# Patient Record
Sex: Female | Born: 1935 | Race: Black or African American | Hispanic: No | Smoking: Current every day smoker
Health system: Southern US, Community
[De-identification: ages and names within clinical notes are randomized; demographics above are authoritative.]

## PROBLEM LIST (undated history)

## (undated) DIAGNOSIS — E1165 Type 2 diabetes mellitus with hyperglycemia: Secondary | ICD-10-CM

## (undated) DIAGNOSIS — E119 Type 2 diabetes mellitus without complications: Secondary | ICD-10-CM

## (undated) DIAGNOSIS — R531 Weakness: Secondary | ICD-10-CM

## (undated) DIAGNOSIS — I639 Cerebral infarction, unspecified: Secondary | ICD-10-CM

## (undated) DIAGNOSIS — E782 Mixed hyperlipidemia: Secondary | ICD-10-CM

## (undated) DIAGNOSIS — Z91199 Patient's noncompliance with other medical treatment and regimen due to unspecified reason: Secondary | ICD-10-CM

## (undated) DIAGNOSIS — I2510 Atherosclerotic heart disease of native coronary artery without angina pectoris: Secondary | ICD-10-CM

## (undated) DIAGNOSIS — N1831 Chronic kidney disease, stage 3a: Secondary | ICD-10-CM

## (undated) DIAGNOSIS — E1121 Type 2 diabetes mellitus with diabetic nephropathy: Secondary | ICD-10-CM

## (undated) DIAGNOSIS — M545 Low back pain, unspecified: Secondary | ICD-10-CM

## (undated) DIAGNOSIS — R269 Unspecified abnormalities of gait and mobility: Secondary | ICD-10-CM

## (undated) DIAGNOSIS — I10 Essential (primary) hypertension: Secondary | ICD-10-CM

## (undated) DIAGNOSIS — M1991 Primary osteoarthritis, unspecified site: Secondary | ICD-10-CM

## (undated) DIAGNOSIS — I6381 Other cerebral infarction due to occlusion or stenosis of small artery: Secondary | ICD-10-CM

## (undated) DIAGNOSIS — R413 Other amnesia: Secondary | ICD-10-CM

## (undated) DIAGNOSIS — D500 Iron deficiency anemia secondary to blood loss (chronic): Secondary | ICD-10-CM

## (undated) DIAGNOSIS — M8580 Other specified disorders of bone density and structure, unspecified site: Secondary | ICD-10-CM

## (undated) HISTORY — DX: Type 2 diabetes mellitus without complications: E11.9

## (undated) HISTORY — PX: OTHER SURGICAL HISTORY: SHX169

## (undated) HISTORY — DX: Essential (primary) hypertension: I10

## (undated) HISTORY — DX: Other specified disorders of bone density and structure, unspecified site: M85.80

## (undated) HISTORY — DX: Type 2 diabetes mellitus with hyperglycemia: E11.65

## (undated) HISTORY — DX: Iron deficiency anemia secondary to blood loss (chronic): D50.0

## (undated) HISTORY — DX: Other cerebral infarction due to occlusion or stenosis of small artery: I63.81

## (undated) HISTORY — DX: Mixed hyperlipidemia: E78.2

## (undated) HISTORY — DX: Atherosclerotic heart disease of native coronary artery without angina pectoris: I25.10

## (undated) HISTORY — DX: Unspecified abnormalities of gait and mobility: R26.9

## (undated) HISTORY — DX: Primary osteoarthritis, unspecified site: M19.91

## (undated) HISTORY — DX: Other amnesia: R41.3

## (undated) HISTORY — DX: Type 2 diabetes mellitus with diabetic nephropathy: E11.21

## (undated) HISTORY — PX: CATARACT EXTRACTION: SUR2

## (undated) HISTORY — DX: Low back pain, unspecified: M54.50

## (undated) HISTORY — DX: Cerebral infarction, unspecified: I63.9

## (undated) HISTORY — DX: Patient's noncompliance with other medical treatment and regimen due to unspecified reason: Z91.199

## (undated) HISTORY — DX: Chronic kidney disease, stage 3a: N18.31

## (undated) HISTORY — DX: Weakness: R53.1

---

## 2003-02-14 ENCOUNTER — Encounter: Payer: Self-pay | Admitting: Gastroenterology

## 2003-02-14 ENCOUNTER — Ambulatory Visit (HOSPITAL_COMMUNITY): Admission: RE | Admit: 2003-02-14 | Discharge: 2003-02-14 | Payer: Self-pay | Admitting: Gastroenterology

## 2003-02-15 ENCOUNTER — Encounter: Payer: Self-pay | Admitting: Gastroenterology

## 2003-02-15 ENCOUNTER — Ambulatory Visit (HOSPITAL_COMMUNITY): Admission: RE | Admit: 2003-02-15 | Discharge: 2003-02-15 | Payer: Self-pay | Admitting: Gastroenterology

## 2004-05-01 ENCOUNTER — Ambulatory Visit (HOSPITAL_COMMUNITY): Admission: RE | Admit: 2004-05-01 | Discharge: 2004-05-01 | Payer: Self-pay | Admitting: Internal Medicine

## 2004-05-20 ENCOUNTER — Encounter: Admission: RE | Admit: 2004-05-20 | Discharge: 2004-07-10 | Payer: Self-pay | Admitting: Internal Medicine

## 2007-05-25 ENCOUNTER — Other Ambulatory Visit: Admission: RE | Admit: 2007-05-25 | Discharge: 2007-05-25 | Payer: Self-pay | Admitting: Cardiology

## 2010-12-28 ENCOUNTER — Encounter: Payer: Self-pay | Admitting: Internal Medicine

## 2011-04-24 NOTE — Op Note (Signed)
   NAME:  Debra Skinner, Debra Skinner NO.:  [id number]   MEDICAL RECORD NO.:  [id number]                   PATIENT TYPE:  OUT   LOCATION:  XRAY                                 FACILITY:  [facility]   PHYSICIAN:  Anselmo Rod, M.D.               DATE OF BIRTH:  1933-11-11   DATE OF PROCEDURE:  02/14/2003  DATE OF DISCHARGE:                                 OPERATIVE REPORT   PROCEDURE PERFORMED:  Colonoscopy up to the hepatic flexure.   ENDOSCOPIST:  Anselmo Rod, M.D.   INSTRUMENT USED:  Olympus video colonoscope.   INDICATIONS FOR PROCEDURE:  A 77 year old African-American female undergoing  screening colonoscopy.  Rule out colonic polyps, masses, etc.   PREPROCEDURE PREPARATION:  Informed consent was procured from the patient.  The patient was fasted for eight hours prior to the procedure and prepped  with a bottle of MiraLax and Gatorade the night prior to the procedure.   PREPROCEDURE PHYSICAL:  VITAL SIGNS:  The patient had stable vital signs.  NECK:  Supple.  CHEST:  Clear to auscultation.  S1 and S2 regular.  ABDOMEN:  Soft with normal bowel sounds.   DESCRIPTION OF PROCEDURE:  The patient was placed in the left lateral  decubitus position and sedated with 100 mg of Demerol and 10 mg of Versed  intravenously.  Once the patient was adequately sedated and maintained on  low flow oxygen and continuous cardiac monitoring, the Olympus video  colonoscope was advanced into the rectum to the hepatic flexure with  difficulty.  The patient had a very tortuous colon.  There was a large  amount of residual stool in the colon and the procedure had to be aborted at  the hepatic flexure with plans to do a barium enema.  Retroflexion revealed  no abnormalities.   IMPRESSION:  1. Unrevealing colonoscopy to the hepatic flexure.  2. Very tortuous colon.  No masses or polyps seen.  3. Significant amount of residual stool in the colon.  Small lesions could     have  been missed.    RECOMMENDATIONS:  1. Await contrast barium enema results with recommendations made thereafter.  2. Outpatient follow up after barium enema has been done.                                               Anselmo Rod, M.D.    JNM/MEDQ  D:  02/14/2003  T:  02/15/2003  Job:  [id number]   cc:   Georgianne Fick, M.D.  [address] [address]  [place]  [place] [address]  Fax: [phone]

## 2011-12-07 ENCOUNTER — Other Ambulatory Visit: Payer: Self-pay | Admitting: Cardiology

## 2011-12-07 DIAGNOSIS — I739 Peripheral vascular disease, unspecified: Secondary | ICD-10-CM

## 2011-12-08 DIAGNOSIS — I639 Cerebral infarction, unspecified: Secondary | ICD-10-CM

## 2011-12-08 HISTORY — DX: Cerebral infarction, unspecified: I63.9

## 2011-12-14 ENCOUNTER — Encounter (INDEPENDENT_AMBULATORY_CARE_PROVIDER_SITE_OTHER): Payer: Medicare Other | Admitting: Cardiology

## 2011-12-14 DIAGNOSIS — E1159 Type 2 diabetes mellitus with other circulatory complications: Secondary | ICD-10-CM

## 2011-12-14 DIAGNOSIS — I739 Peripheral vascular disease, unspecified: Secondary | ICD-10-CM

## 2012-03-14 ENCOUNTER — Ambulatory Visit
Admission: RE | Admit: 2012-03-14 | Discharge: 2012-03-14 | Disposition: A | Discharge status: Home / Self Care | Payer: Medicare Other | Source: Ambulatory Visit | Attending: Internal Medicine | Admitting: Internal Medicine

## 2012-03-14 ENCOUNTER — Other Ambulatory Visit: Payer: Self-pay | Admitting: Internal Medicine

## 2012-03-14 DIAGNOSIS — R410 Disorientation, unspecified: Secondary | ICD-10-CM

## 2012-04-08 ENCOUNTER — Other Ambulatory Visit: Payer: Self-pay | Admitting: Neurology

## 2012-04-08 DIAGNOSIS — R413 Other amnesia: Secondary | ICD-10-CM

## 2012-04-08 DIAGNOSIS — R531 Weakness: Secondary | ICD-10-CM

## 2012-04-08 DIAGNOSIS — R269 Unspecified abnormalities of gait and mobility: Secondary | ICD-10-CM

## 2012-04-08 DIAGNOSIS — I10 Essential (primary) hypertension: Secondary | ICD-10-CM

## 2012-04-08 DIAGNOSIS — E119 Type 2 diabetes mellitus without complications: Secondary | ICD-10-CM

## 2012-04-18 ENCOUNTER — Ambulatory Visit
Admission: RE | Admit: 2012-04-18 | Discharge: 2012-04-18 | Disposition: A | Discharge status: Home / Self Care | Payer: Medicare Other | Source: Ambulatory Visit | Attending: Neurology | Admitting: Neurology

## 2012-04-18 DIAGNOSIS — R269 Unspecified abnormalities of gait and mobility: Secondary | ICD-10-CM

## 2012-04-18 DIAGNOSIS — E119 Type 2 diabetes mellitus without complications: Secondary | ICD-10-CM

## 2012-04-18 DIAGNOSIS — R413 Other amnesia: Secondary | ICD-10-CM

## 2012-04-18 DIAGNOSIS — I10 Essential (primary) hypertension: Secondary | ICD-10-CM

## 2012-04-18 DIAGNOSIS — R531 Weakness: Secondary | ICD-10-CM

## 2012-06-27 ENCOUNTER — Ambulatory Visit: Payer: Medicare Other | Attending: Internal Medicine | Admitting: Physical Therapy

## 2012-06-27 DIAGNOSIS — R269 Unspecified abnormalities of gait and mobility: Secondary | ICD-10-CM | POA: Insufficient documentation

## 2012-06-27 DIAGNOSIS — R5381 Other malaise: Secondary | ICD-10-CM | POA: Insufficient documentation

## 2012-06-27 DIAGNOSIS — IMO0001 Reserved for inherently not codable concepts without codable children: Secondary | ICD-10-CM | POA: Insufficient documentation

## 2012-06-27 DIAGNOSIS — M6281 Muscle weakness (generalized): Secondary | ICD-10-CM | POA: Insufficient documentation

## 2012-07-04 ENCOUNTER — Ambulatory Visit: Payer: Medicare Other | Admitting: Physical Therapy

## 2012-07-08 ENCOUNTER — Ambulatory Visit: Payer: Medicare Other | Attending: Internal Medicine | Admitting: Physical Therapy

## 2012-07-08 DIAGNOSIS — M6281 Muscle weakness (generalized): Secondary | ICD-10-CM | POA: Insufficient documentation

## 2012-07-08 DIAGNOSIS — R5381 Other malaise: Secondary | ICD-10-CM | POA: Insufficient documentation

## 2012-07-08 DIAGNOSIS — R269 Unspecified abnormalities of gait and mobility: Secondary | ICD-10-CM | POA: Insufficient documentation

## 2012-07-08 DIAGNOSIS — IMO0001 Reserved for inherently not codable concepts without codable children: Secondary | ICD-10-CM | POA: Insufficient documentation

## 2012-07-11 ENCOUNTER — Ambulatory Visit: Payer: Medicare Other | Admitting: Physical Therapy

## 2012-07-15 ENCOUNTER — Ambulatory Visit: Payer: Medicare Other | Admitting: Physical Therapy

## 2012-07-18 ENCOUNTER — Other Ambulatory Visit (HOSPITAL_COMMUNITY): Payer: Self-pay | Admitting: Neurology

## 2012-07-18 DIAGNOSIS — I6350 Cerebral infarction due to unspecified occlusion or stenosis of unspecified cerebral artery: Secondary | ICD-10-CM

## 2012-07-19 ENCOUNTER — Ambulatory Visit: Payer: Medicare Other | Admitting: Physical Therapy

## 2012-07-19 ENCOUNTER — Other Ambulatory Visit (HOSPITAL_COMMUNITY): Payer: Medicare Other

## 2012-07-21 ENCOUNTER — Ambulatory Visit: Payer: Medicare Other | Admitting: Physical Therapy

## 2012-07-26 ENCOUNTER — Ambulatory Visit: Payer: Medicare Other | Admitting: Physical Therapy

## 2012-07-28 ENCOUNTER — Ambulatory Visit: Payer: Medicare Other | Admitting: Physical Therapy

## 2012-08-03 ENCOUNTER — Ambulatory Visit (HOSPITAL_COMMUNITY): Payer: Medicare Other | Attending: Cardiology

## 2012-08-03 DIAGNOSIS — E119 Type 2 diabetes mellitus without complications: Secondary | ICD-10-CM | POA: Insufficient documentation

## 2012-08-03 DIAGNOSIS — I6350 Cerebral infarction due to unspecified occlusion or stenosis of unspecified cerebral artery: Secondary | ICD-10-CM

## 2012-08-03 DIAGNOSIS — F17200 Nicotine dependence, unspecified, uncomplicated: Secondary | ICD-10-CM | POA: Insufficient documentation

## 2012-08-03 DIAGNOSIS — I059 Rheumatic mitral valve disease, unspecified: Secondary | ICD-10-CM | POA: Insufficient documentation

## 2012-08-03 DIAGNOSIS — I10 Essential (primary) hypertension: Secondary | ICD-10-CM | POA: Insufficient documentation

## 2012-08-03 DIAGNOSIS — I079 Rheumatic tricuspid valve disease, unspecified: Secondary | ICD-10-CM | POA: Insufficient documentation

## 2012-08-03 DIAGNOSIS — I379 Nonrheumatic pulmonary valve disorder, unspecified: Secondary | ICD-10-CM | POA: Insufficient documentation

## 2012-08-03 NOTE — Progress Notes (Signed)
Echocardiogram performed.  

## 2012-08-04 ENCOUNTER — Encounter (HOSPITAL_COMMUNITY): Payer: Self-pay | Admitting: Neurology

## 2013-06-23 ENCOUNTER — Other Ambulatory Visit: Payer: Self-pay

## 2013-06-23 MED ORDER — CLOPIDOGREL BISULFATE 75 MG PO TABS: 75.0000 mg | ORAL_TABLET | Freq: Every day | ORAL | Status: DC

## 2013-07-21 ENCOUNTER — Ambulatory Visit: Payer: Self-pay | Admitting: Neurology

## 2013-08-10 ENCOUNTER — Ambulatory Visit: Payer: Self-pay | Admitting: Neurology

## 2013-09-22 ENCOUNTER — Ambulatory Visit (INDEPENDENT_AMBULATORY_CARE_PROVIDER_SITE_OTHER): Payer: Medicare Other | Admitting: Cardiovascular Disease

## 2013-09-22 ENCOUNTER — Encounter: Payer: Self-pay | Admitting: Cardiovascular Disease

## 2013-09-22 VITALS — BP 136/68 | HR 67 | Ht 66.0 in | Wt 191.0 lb

## 2013-09-22 DIAGNOSIS — R9431 Abnormal electrocardiogram [ECG] [EKG]: Secondary | ICD-10-CM

## 2013-09-22 DIAGNOSIS — I10 Essential (primary) hypertension: Secondary | ICD-10-CM

## 2013-09-22 DIAGNOSIS — Z79899 Other long term (current) drug therapy: Secondary | ICD-10-CM

## 2013-09-22 DIAGNOSIS — E119 Type 2 diabetes mellitus without complications: Secondary | ICD-10-CM | POA: Insufficient documentation

## 2013-09-22 DIAGNOSIS — E785 Hyperlipidemia, unspecified: Secondary | ICD-10-CM

## 2013-09-22 HISTORY — DX: Abnormal electrocardiogram (ECG) (EKG): R94.31

## 2013-09-22 HISTORY — DX: Type 2 diabetes mellitus without complications: E11.9

## 2013-09-22 HISTORY — DX: Essential (primary) hypertension: I10

## 2013-09-22 NOTE — Assessment & Plan Note (Signed)
Under good control on current medications 

## 2013-09-22 NOTE — Assessment & Plan Note (Signed)
Patient was referred for cardiovascular evaluation because of abnormal EKG. This shows a lethargic hypertrophy with repolarization changes. She did have a 2-D echocardiogram performed January 2013 that showed mild left ventricular hypertrophy. She is completely asymptomatic. We will continue to follow her conservatively.

## 2013-09-22 NOTE — Patient Instructions (Signed)
Your physician recommends that you schedule a follow-up appointment in: 1 year  Your physician recommends that you return for lab work CMP, LIPIDS

## 2013-09-22 NOTE — Progress Notes (Signed)
09/22/2013 Debra Skinner   1933/03/04  [id number]  Primary Physician Georgianne Fick, MD Primary Cardiologist: Runell Gess MD Roseanne Reno   HPI:  Debra Skinner is a 79 year old widowed African American female with no children accompanied by her niece today. She was referred by Dr. Nicholos Johns for cardiovascular evaluation because of an abnormal EKG. Her cardiac risk profile is positive for greater than 60 pack years of tobacco abuse currently smoking one half pack per day. She is treated diabetes and hypertension. There is no family history. She's never had a heart attack but apparently did have a "a stroke" in April 2013 and 2 by Dr. Debarah Crape. She denies chest pain or shortness of breath she is minimally ambulatory. An EKG showed T-wave inversion which had not changed from prior EKGs. On further inspection this appeared to be left ventricular hypertrophy with repolarization changes. She did have a 2-D echocardiogram performed over a year ago that showed normal LV systolic function with mild LVH.   Current Outpatient Prescriptions  Medication Sig Dispense Refill  . amLODipine (NORVASC) 5 MG tablet Take 5 mg by mouth daily.      . Canagliflozin (INVOKANA) 300 MG TABS Take 300 mg by mouth daily.      . clopidogrel (PLAVIX) 75 MG tablet Take 1 tablet (75 mg total) by mouth daily.  90 tablet  1  . Fe Fum-FePoly-Vit C-Vit B3 (INTEGRA PO) Take 1 capsule by mouth daily.      Marland Kitchen lisinopril-hydrochlorothiazide (PRINZIDE,ZESTORETIC) 20-12.5 MG per tablet Take 1 tablet by mouth daily.      . metFORMIN (GLUMETZA) 1000 MG (MOD) 24 hr tablet Take 1,000 mg by mouth 2 (two) times daily with a meal.       No current facility-administered medications for this visit.    Allergies  Allergen Reactions  . Penicillins     Facial edema    History   Social History  . Marital Status: Widowed    Spouse Name: N/A    Number of Children: N/A  . Years of Education: N/A   Occupational History   . Not on file.   Social History Main Topics  . Smoking status: Current Some Day Smoker  . Smokeless tobacco: Not on file  . Alcohol Use: Not on file  . Drug Use: Not on file  . Sexual Activity: Not on file   Other Topics Concern  . Not on file   Social History Narrative  . No narrative on file     Review of Systems: General: negative for chills, fever, night sweats or weight changes.  Cardiovascular: negative for chest pain, dyspnea on exertion, edema, orthopnea, palpitations, paroxysmal nocturnal dyspnea or shortness of breath Dermatological: negative for rash Respiratory: negative for cough or wheezing Urologic: negative for hematuria Abdominal: negative for nausea, vomiting, diarrhea, bright red blood per rectum, melena, or hematemesis Neurologic: negative for visual changes, syncope, or dizziness All other systems reviewed and are otherwise negative except as noted above.    Blood pressure 136/68, pulse 67, height 5\' 6"  (1.676 m), weight 191 lb (86.637 kg).  General appearance: alert and no distress Neck: no adenopathy, no carotid bruit, no JVD, supple, symmetrical, trachea midline and thyroid not enlarged, symmetric, no tenderness/mass/nodules Lungs: clear to auscultation bilaterally Heart: regular rate and rhythm, S1, S2 normal, no murmur, click, rub or gallop Extremities: extremities normal, atraumatic, no cyanosis or edema  EKG normal sinus rhythm at 67 with LVH and repolarization changes.  ASSESSMENT AND PLAN:  Essential hypertension Under good control on current medications  Abnormal EKG Patient was referred for cardiovascular evaluation because of abnormal EKG. This shows a lethargic hypertrophy with repolarization changes. She did have a 2-D echocardiogram performed January 2013 that showed mild left ventricular hypertrophy. She is completely asymptomatic. We will continue to follow her conservatively.      Runell Gess MD FACP,FACC,FAHA,  [facility] 09/22/2013 4:30 PM

## 2013-09-25 ENCOUNTER — Encounter: Payer: Self-pay | Admitting: Cardiovascular Disease

## 2013-10-23 ENCOUNTER — Encounter: Payer: Self-pay | Admitting: Neurology

## 2013-10-23 DIAGNOSIS — R269 Unspecified abnormalities of gait and mobility: Secondary | ICD-10-CM

## 2013-10-23 DIAGNOSIS — R413 Other amnesia: Secondary | ICD-10-CM

## 2013-10-23 DIAGNOSIS — R531 Weakness: Secondary | ICD-10-CM

## 2013-10-24 ENCOUNTER — Ambulatory Visit (INDEPENDENT_AMBULATORY_CARE_PROVIDER_SITE_OTHER): Payer: Medicare Other | Admitting: Neurology

## 2013-10-24 ENCOUNTER — Encounter: Payer: Self-pay | Admitting: Neurology

## 2013-10-24 VITALS — BP 151/70 | HR 62 | Ht 66.5 in | Wt 191.0 lb

## 2013-10-24 DIAGNOSIS — R413 Other amnesia: Secondary | ICD-10-CM

## 2013-10-24 DIAGNOSIS — R269 Unspecified abnormalities of gait and mobility: Secondary | ICD-10-CM

## 2013-10-24 NOTE — Progress Notes (Signed)
History of Present Illness;   [name] is a 79 years old right-handed African American female, accompanied by her niece, long-time friend at today's clinical visit, she is referred by her primary care physician for evaluation of gradual onset memory loss, gait difficulty   She has past medical history of diabetes, recent diagnosis of hypertension, also has chronic left shoulder pain  She had college degree, majored in business, worked at [employer] for many years, retired in 1994, she widowed in 1985, lives alone has no children, quit driving about a month ago. her niece also started paying her bill about a month ago, because she noticed sudden onset of right-hand weakness, difficulty sign her name with pen  She also report one-month history of acute onset worsening gait difficulty, she drags her right leg some, she denies sensory change,   CT head without contrast showed periventricular small vessel disease, laboratory showed A1c 7.3, glucose 142, otherwise normal CMP, LDL 85, triglyceride 167 MRI of brain shows  Mild perisylvian atrophy.  Moderate periventricular and subcortical chronic small vessel ischemic disease.  Focal T1 hyperintensity in the left posterior putamen likely dystrophic mineralization. Left corona radiata DWI hyperintensity, isointense on ADC, hyperintense on T2 and T2FLAIR views, likely subacute-chronic ischemia, which could explain her sudden worsening right leg weakness, gait difficulty.   She continues to have mild gait difficulty, and is receiving physical therapy, which has improved, has made progress, she has been taking aspirin for a long time, lives alone at her house, she lives in her current house for more than 40 years now, has relatives check on her often, she quit driving few months ago, she is still smoking  Carotid doppler was negative for stenosis. 2Decho with grade 1 diastolic dysfunction and pulmonic valve with mild regurgitation otherwise normal.  She is taking Plavix with out problems. She has not had further stroke symptoms.  UPDATE 10/24/2013: Her memory trouble has been fairly stable, she still lives in her house along, driving short distance, taking care of her daily needs, she continue have mild gait difficulty, using a cane, she denies significant pain, no nocturia, she denies bowel bladder incontinence. She quit going to [facility] exercise now,  Review of Systems  Out of a complete 14 system review, the patient complains of only the following symptoms, and all other reviewed systems are negative.     Gait difficulty, she denies neck pain,   Social History  Patient is retired and lives alone. Patient has a college education. Patient quit tobacco about six months ago. Patient denies any history of alcohol and illicit drugs. Inhaled Tobacco Use: Current every day smoker  Family History  Patients paents are both deceased. Cancer  Past Medical History  High blood pressure Diabetes, subcortical chronic small vessel ischemic disease per MRI of the brain   Surgical History  Toenail   Physical Exam  General: Pleasant middle aged [place] American female.   Neck: supple no carotid bruits Respiratory: clear to auscultation bilaterally Cardiovascular: regular rate rhythm  Neurologic Exam  Mental Status: slow to talk, awake, alert, cooperative to history taking, and casual conversation.MMSE 30 out of 30 Cranial Nerves: CN II-XII pupils were equal round reactive to light.   Extraocular movements were full.  Visual fields were full on confrontational test.  Facial sensation were normal. mild shallow right nasolabial fold.  Hearing was intact to finger rubbing bilaterally.  Uvula tongue were midline.  Head turning and shoulder shrugging were normal and symmetric.  Tongue protrusion  into the cheeks strength were normal.  Motor:  limited range of motion of bilateral shoulders, mild right upper extremity pronation,fixation on rapid  orbiting, mild right hip flexion, knee extension,ankle dorsiflexion weakness. Sensory: Normal to light touch, pinprick, proprioception, and vibratory sensation. Coordination: Normal finger-to-nose, heel-to-shin.  There was no dysmetria noticed. Gait and Station: wide based and  cautious, mildly unsteady with tandem walking,  Reflexes: Deep tendon reflexes: Biceps: 3/3 , Brachioradialis: 3/3 , Triceps: 3/3 , Pateller: 3/3, Achilles: 2/2.  Plantar responses are extensor bilaterally  Assessment and plan:  79 years old right-handed [place] American female, with past medical history of essential hypertension, diabetes, presenting with slow onset of short-term memory trouble for 2.5  years, her memory trouble has been fairly steady. She continues to have mild slow worsening gait difficulty, today's examination has demonstrated hyperreflexia, bilateral Babinski signs,   1. differentiation diagnosis including cervical spondylitic myelopathy, vs. extensive periventricular white matter disease, 2 after discussed with patient, we decided to proceed with MRI of cervical spine, 3 refer her to outpatient physical therapy 4    Continue moderate exercise 5 return to clinic in 6 months with Eber Jones

## 2013-11-06 ENCOUNTER — Other Ambulatory Visit (INDEPENDENT_AMBULATORY_CARE_PROVIDER_SITE_OTHER): Payer: Self-pay

## 2013-11-06 DIAGNOSIS — Z0289 Encounter for other administrative examinations: Secondary | ICD-10-CM

## 2013-11-07 ENCOUNTER — Ambulatory Visit: Payer: Medicare Other | Attending: Neurology | Admitting: Physical Therapy

## 2013-11-07 DIAGNOSIS — R293 Abnormal posture: Secondary | ICD-10-CM | POA: Insufficient documentation

## 2013-11-07 DIAGNOSIS — M6281 Muscle weakness (generalized): Secondary | ICD-10-CM | POA: Insufficient documentation

## 2013-11-07 DIAGNOSIS — IMO0001 Reserved for inherently not codable concepts without codable children: Secondary | ICD-10-CM | POA: Insufficient documentation

## 2013-11-07 DIAGNOSIS — M24569 Contracture, unspecified knee: Secondary | ICD-10-CM | POA: Insufficient documentation

## 2013-11-07 DIAGNOSIS — R269 Unspecified abnormalities of gait and mobility: Secondary | ICD-10-CM | POA: Insufficient documentation

## 2013-11-14 ENCOUNTER — Ambulatory Visit: Payer: Medicare Other | Admitting: Physical Therapy

## 2013-11-15 ENCOUNTER — Ambulatory Visit: Payer: Medicare Other | Admitting: Physical Therapy

## 2013-11-17 ENCOUNTER — Ambulatory Visit: Payer: Medicare Other | Admitting: Physical Therapy

## 2013-11-20 ENCOUNTER — Ambulatory Visit
Admission: RE | Admit: 2013-11-20 | Discharge: 2013-11-20 | Disposition: A | Discharge status: Home / Self Care | Payer: Medicare Other | Source: Ambulatory Visit | Attending: Neurology | Admitting: Neurology

## 2013-11-20 DIAGNOSIS — R269 Unspecified abnormalities of gait and mobility: Secondary | ICD-10-CM

## 2013-11-20 DIAGNOSIS — R413 Other amnesia: Secondary | ICD-10-CM

## 2013-11-21 ENCOUNTER — Ambulatory Visit: Payer: Medicare Other | Admitting: Physical Therapy

## 2013-11-22 ENCOUNTER — Telehealth: Payer: Self-pay | Admitting: Neurology

## 2013-11-22 NOTE — Telephone Encounter (Signed)
I have called patient of MRI cervical report, there is multilevel spondylitic disease, at C3 and 4, with moderate left foraminal stenosis, there was no significant cord compression  Dana please mail a copy of MRI cervical to her home

## 2013-11-22 NOTE — Telephone Encounter (Signed)
Debra Skinner, Please call and explain MRI cervical findings.

## 2013-11-22 NOTE — Telephone Encounter (Signed)
Please advise 

## 2013-11-22 NOTE — Telephone Encounter (Signed)
RETURNING CALL REGARDING MRI RESULTS

## 2013-11-24 ENCOUNTER — Ambulatory Visit: Payer: Medicare Other | Admitting: Physical Therapy

## 2013-11-24 NOTE — Telephone Encounter (Signed)
Spoke to patient and explained MRI results, per Dr. Terrace Arabia.  Also mailed copy of report to patient.

## 2013-11-27 ENCOUNTER — Ambulatory Visit: Payer: Medicare Other | Admitting: Physical Therapy

## 2013-11-29 ENCOUNTER — Encounter: Payer: Medicare Other | Admitting: Physical Therapy

## 2013-12-04 ENCOUNTER — Ambulatory Visit: Payer: Medicare Other | Admitting: Physical Therapy

## 2013-12-06 ENCOUNTER — Ambulatory Visit: Payer: Medicare Other | Admitting: Physical Therapy

## 2013-12-12 ENCOUNTER — Ambulatory Visit: Payer: Medicare Other | Attending: Neurology | Admitting: Physical Therapy

## 2013-12-12 DIAGNOSIS — R269 Unspecified abnormalities of gait and mobility: Secondary | ICD-10-CM | POA: Insufficient documentation

## 2013-12-12 DIAGNOSIS — M6281 Muscle weakness (generalized): Secondary | ICD-10-CM | POA: Insufficient documentation

## 2013-12-12 DIAGNOSIS — R293 Abnormal posture: Secondary | ICD-10-CM | POA: Insufficient documentation

## 2013-12-12 DIAGNOSIS — IMO0001 Reserved for inherently not codable concepts without codable children: Secondary | ICD-10-CM | POA: Insufficient documentation

## 2013-12-12 DIAGNOSIS — M24569 Contracture, unspecified knee: Secondary | ICD-10-CM | POA: Insufficient documentation

## 2013-12-14 ENCOUNTER — Ambulatory Visit: Payer: Medicare Other | Admitting: Physical Therapy

## 2013-12-19 ENCOUNTER — Ambulatory Visit: Payer: Medicare Other | Admitting: Physical Therapy

## 2013-12-22 ENCOUNTER — Ambulatory Visit: Payer: Medicare Other | Admitting: Physical Therapy

## 2013-12-26 ENCOUNTER — Ambulatory Visit: Payer: Medicare Other | Admitting: Physical Therapy

## 2013-12-28 ENCOUNTER — Ambulatory Visit: Payer: Medicare Other | Admitting: Physical Therapy

## 2014-03-15 ENCOUNTER — Other Ambulatory Visit: Payer: Self-pay | Admitting: Neurology

## 2014-03-15 NOTE — Telephone Encounter (Signed)
We started prescribing this med in Aug 2013

## 2014-04-25 ENCOUNTER — Encounter (INDEPENDENT_AMBULATORY_CARE_PROVIDER_SITE_OTHER): Payer: Self-pay

## 2014-04-25 ENCOUNTER — Encounter: Payer: Self-pay | Admitting: Nurse Practitioner

## 2014-04-25 ENCOUNTER — Ambulatory Visit (INDEPENDENT_AMBULATORY_CARE_PROVIDER_SITE_OTHER): Payer: Medicare Other | Admitting: Nurse Practitioner

## 2014-04-25 VITALS — BP 130/70 | HR 72 | Ht 66.5 in | Wt 192.0 lb

## 2014-04-25 DIAGNOSIS — R269 Unspecified abnormalities of gait and mobility: Secondary | ICD-10-CM

## 2014-04-25 DIAGNOSIS — R413 Other amnesia: Secondary | ICD-10-CM

## 2014-04-25 MED ORDER — CLOPIDOGREL BISULFATE 75 MG PO TABS: 75.0000 mg | ORAL_TABLET | Freq: Every day | ORAL | Status: DC

## 2014-04-25 NOTE — Progress Notes (Signed)
[facility]  PATIENT: Debra SansGirtha R Skinner DOB: 09/18/1933   REASON FOR VISIT: Followup for memory loss and gait abnormality   HISTORY OF PRESENT ILLNESS: Debra Skinner, 80 year old female returns for followup with her niece. She has a history of gradual onset memory loss and gait difficulty. She did receive some physical therapy after her last visit with Dr. Terrace ArabiaYan 10/24/2013. She is not doing her home exercise program. She has had previous problems with her left shoulder in the past and now is complaining of problems in the right shoulder. She has not been evaluated for this.Her memory trouble has been fairly stable, she still lives in her house along, driving short distance, taking care of her daily needs, she continue have mild gait difficulty, using a cane, no nocturia, she denies bowel bladder incontinence.  MRI of the cervical spine 11/20/2013 showing prominent spondylitic changes most severe at C3-C4 where there is any leftsided osteophyte protrusion resulting in mild canal stenosis without significant cord abnormalities. Remote age lacunar infarct also noted in the right pons incidentally. Patient remains on Plavix. 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 her niece also started paying her bill about a month ago, because she noticed sudden onset of right-hand weakness, difficulty sign her name with pen  She also report one-month history of acute onset worsening gait difficulty, she drags her right leg some, she denies sensory change,  CT head without contrast showed periventricular small vessel disease, laboratory showed A1c  7.3, glucose 142, otherwise normal CMP, LDL 85, triglyceride 167  MRI of brain shows Mild perisylvian atrophy. Moderate periventricular and subcortical chronic small vessel ischemic disease.  Focal T1 hyperintensity in the left posterior putamen likely dystrophic mineralization. Left corona radiata DWI hyperintensity, isointense on ADC, hyperintense on T2 and T2FLAIR views, likely subacute-chronic ischemia, which could explain her sudden worsening right leg weakness, gait difficulty.  She continues to have mild gait difficulty, and is receiving physical therapy, which has improved, has made progress, she has been taking aspirin for a long time, lives alone at her house, she lives in her current house for more than 40 years now, has relatives check on her often, she quit driving few months ago, she is still smoking  Carotid doppler was negative for stenosis. 2Decho with grade 1 diastolic dysfunction and pulmonic valve with mild regurgitation otherwise normal. She is taking Plavix with out problems. She has not had further stroke symptoms.       REVIEW OF SYSTEMS: Full 14 system review of systems performed and notable only for those listed, all others are neg:  Constitutional: N/A  Cardiovascular: N/A  Ear/Nose/Throat: N/A  Skin: N/A  Eyes: N/A  Respiratory: N/A  Gastroitestinal: N/A  Hematology/Lymphatic: N/A  Endocrine: N/A Musculoskeletal: Right shoulder pain  Allergy/Immunology: N/A  Neurological: N/A Psychiatric: N/A Sleep : NA   ALLERGIES: Allergies  Allergen Reactions  . Penicillins     Facial edema    HOME MEDICATIONS: Outpatient Prescriptions Prior to Visit  Medication Sig Dispense Refill  . amLODipine (NORVASC) 5 MG tablet Take 5 mg by mouth daily.      . Canagliflozin (INVOKANA) 300  MG TABS Take 300 mg by mouth daily.      . clopidogrel (PLAVIX) 75 MG tablet TAKE 1 TABLET BY MOUTH EVERY DAY  90 tablet  2  . IRON CR PO Take by mouth daily.      Marland Kitchen.  lisinopril-hydrochlorothiazide (PRINZIDE,ZESTORETIC) 20-12.5 MG per tablet Take 1 tablet by mouth daily.      . metFORMIN (GLUMETZA) 1000 MG (MOD) 24 hr tablet Take 1,000 mg by mouth 2 (two) times daily with a meal.      . Fe Fum-FePoly-Vit C-Vit B3 (INTEGRA PO) Take 1 capsule by mouth daily.       No facility-administered medications prior to visit.    PAST MEDICAL HISTORY: Past Medical History  Diagnosis Date  . Stroke 2013    "mini stroke"  . Diabetes   . Hypertension   . CVA (cerebral infarction)   . Weakness   . Gait disturbance   . Memory loss     PAST SURGICAL HISTORY: Past Surgical History  Procedure Laterality Date  . Toe nail      FAMILY HISTORY: Family History  Problem Relation Age of Onset  . Cervical cancer Mother   . High blood pressure Father   . Aneurysm Father     SOCIAL HISTORY: History   Social History  . Marital Status: Widowed    Spouse Name: N/A    Number of Children: 0  . Years of Education: college   Occupational History  .      Retired   Social History Main Topics  . Smoking status: Current Some Day Smoker -- 0.50 packs/day    Types: Cigarettes  . Smokeless tobacco: Never Used  . Alcohol Use: No  . Drug Use: No  . Sexual Activity: Not on file   Other Topics Concern  . Not on file   Social History Narrative   Patient is retired and lives at   Home alone.    Education- College    Right handed.   Caffeine-  Coffee one daily, soda-  And tea daily.                 PHYSICAL EXAM  Filed Vitals:   04/25/14 1448  BP: 146/75  Pulse: 72  Height: 5' 6.5" (1.689 m)  Weight: 192 lb (87.091 kg)   Body mass index is 30.53 kg/(m^2).  Generalized: Well developed, in no acute distress  Head: normocephalic and atraumatic,. Oropharynx benign  Neck: Supple, no carotid bruits  Cardiac: Regular rate rhythm, no murmur  Musculoskeletal: No deformity   Neurological examination   Mentation: Alert oriented to time, place, history  taking. MMSE 27/30. AFT 6. Follows all commands speech and language fluent  Cranial nerve II-XII: Pupils were equal round reactive to light extraocular movements were full, visual field were full on confrontational test. Facial sensation and strength were normal. hearing was intact to finger rubbing bilaterally. Uvula tongue midline. head turning and shoulder shrug were normal and symmetric.Tongue protrusion into cheek strength was normal. Motor: Limited range of motion to right shoulder and right upper extremity, otherwise  full strength in the BUE, BLE, fine finger movements normal, no pronator drift.  Sensory: normal and symmetric to light touch, pinprick, and  vibration  Coordination: finger-nose-finger, heel-to-shin bilaterally, no dysmetria Reflexes: 2+, upper  Lower and  Symmetric,  plantar responses were flexor bilaterally. Gait and Station: Rising up from seated position without assistance, wide based cautious unsteady gait with cane .   DIAGNOSTIC DATA (LABS, IMAGING,  TESTING) - ASSESSMENT AND PLAN  80 y.o. year old female  has a past medical history of Stroke (2013); Diabetes; Hypertension; CVA (cerebral infarction); Weakness; Gait disturbance; and Memory loss. here to followup.  Memory score is stable Continue Plavix daily Will refill Continue home exercise program, Consult with primary care/ortho for shoulder complaints of pain Followup in 6 months Nilda Riggs, [facility], [facility], APRN  [facility] [address], [address] [place], [place] [address] [phone]

## 2014-04-25 NOTE — Patient Instructions (Signed)
Memory score is stable Continue Plavix daily Will refill Followup in 6 months

## 2014-07-12 ENCOUNTER — Encounter: Payer: Self-pay | Admitting: Nurse Practitioner

## 2014-10-24 ENCOUNTER — Encounter: Payer: Self-pay | Admitting: Neurology

## 2014-10-26 ENCOUNTER — Ambulatory Visit (INDEPENDENT_AMBULATORY_CARE_PROVIDER_SITE_OTHER): Payer: Medicare Other | Admitting: Nurse Practitioner

## 2014-10-26 ENCOUNTER — Ambulatory Visit: Payer: Medicare Other | Admitting: Nurse Practitioner

## 2014-10-26 ENCOUNTER — Encounter: Payer: Self-pay | Admitting: Nurse Practitioner

## 2014-10-26 VITALS — BP 150/67 | HR 70 | Ht 66.5 in | Wt 195.6 lb

## 2014-10-26 DIAGNOSIS — R269 Unspecified abnormalities of gait and mobility: Secondary | ICD-10-CM

## 2014-10-26 DIAGNOSIS — F17200 Nicotine dependence, unspecified, uncomplicated: Secondary | ICD-10-CM

## 2014-10-26 DIAGNOSIS — Z720 Tobacco use: Secondary | ICD-10-CM

## 2014-10-26 DIAGNOSIS — R413 Other amnesia: Secondary | ICD-10-CM

## 2014-10-26 HISTORY — DX: Nicotine dependence, unspecified, uncomplicated: F17.200

## 2014-10-26 MED ORDER — CLOPIDOGREL BISULFATE 75 MG PO TABS: 75.0000 mg | ORAL_TABLET | Freq: Every day | ORAL | Status: DC

## 2014-10-26 NOTE — Patient Instructions (Signed)
Memory score is stable. Continue Plavix daily, Will refill Continue home exercise program. Consult with primary care/ortho for right foot swelling. Followup in 6 months with Dr. Krista Blue, sooner as needed.    Community Occupational psychologist of Services Cost  A Matter of Balance Class locations vary. Call [place] on Aging for more information.  [email or web address]/ [phone] 8-Session program addressing the fear of falling and increasing activity levels of older adults Free to minimal cost  A.C.T. By [employer] [address] [place], [place] [address].  BetaBlues.dk [phone]  Personal training, gym, classes including Silver Sneakers* and ACTion for Aging Adults Fee-based  A.H.O.Y. (Add Health to [place]) Airs on Time [employer] 13, M-F at [place]: [employer],  [place] [facility] [place],  [place], [place] [facility], [address] [facility] [address] [facility] [place], [place], [place], [place] [address]  [place] Location: Sharrell Ku. [employer] [place]      [phone]  [phone]  [phone]  [phone]  [phone]  [phone]  [phone]  [phone]  [phone]  [phone]    [phone] A total-body conditioning class for adults 71 and older; designed to increase muscular strength, endurance, range of movement, flexibility, balance, agility and coordination Free  [facility] [place], [place] [address] [place]      [address]      [phone]      Pilate's class for  individualsreturning to exercise after an injury, before or after surgery or for individuals with complex musculoskeletal issues; designed to improve strength, balance , flexibility      $15/class  [place] [address] [place], [place] [address] [email or web address] [place] classes for beginners to advanced [place], [place] [address] [email or web address] [place] Chair Exercises Free, ages 25 and older; Ages 53-59 fee based  Marvia Pickles, [facility] [address] [place], [place] [address] [email or web address] [phone]  A.H.O.Y. Tai Chi Fee-based Donation based or free  [place] Class locations vary.  Call or email Angela Burke or view website for more information. [email or web address] [email or web address] [phone] Ongoing classes at local [employer] and gyms Fee-based  Silver Sneakers A.C.T. By [place] [employer]: [facility] [place] [phone]  [phone] Classes designed for older adults who want to improve their strength, flexibility, balance and endurance.   Silver sneakers is covered by some insurance plans and includes a fitness center membership at participating locations. Find out more by calling [phone] or visiting [email or web address] Covered by some insurance plans  [facility] [place] [phone] A.H.O.Y.,  fitness room, personal training, fitness classes for injury prevention, strength, balance, flexibility, water fitness classes Ages 55+: $28 for 6 months; Ages 20-54: $54  for 6 months  [place] for Everybody [facility] [address] [place],  [address] [email or web address] [phone] Tai Chi classes for beginners to advanced; geared for seniors Donation Based      [employer] ([place] Others Participate in Exercise     Loyal Gambler. Rosana Hoes, PhD, [place] [email or web address] [place]     [phone]     A comprehensive fitness program for adults.  The program paris senior-level undergraduates Kinesiology students with adults who desire to learn how to exercise safely.  Includes a structural exercise class focusing on functional fitnesss     $100/semester in fall and spring; $75 in summer (no trainers)    *Silver Sneakers is covered by some Personal assistant and includes a  Radio producer at participating locations.  Find out more by calling [phone] or visiting [email or web address]  For additional health and human services resources for senior adults, please contact SeniorLine at [phone] in [place] and [place] at [phone] in all other areas.

## 2014-10-26 NOTE — Progress Notes (Signed)
PATIENT: Debra Skinner DOB: 10/21/1933  REASON FOR VISIT: routine follow up for memory loss and gait difficulty HISTORY FROM: patient  HISTORY OF PRESENT ILLNESS: Debra Skinner, 80 year old female returns for followup, she is unaccompanied today. She was last seen in our office by Eber Jonesarolyn, NP on 04/25/14. She has a history of gradual onset memory loss and gait difficulty. Last time she received physical therapy was after visit with Dr. Terrace ArabiaYan 10/24/2013. She is not doing her home exercise program. Her memory trouble has been fairly stable, she still lives in her house alone, driving short distance, taking care of her daily needs, she continue have mild gait difficulty, using a cane when away from home, no nocturia, she denies bowel or bladder incontinence.  MRI of the cervical spine 11/20/2013 showing prominent spondylitic changes most severe at C3-C4 where there is any leftsided osteophyte protrusion resulting in mild canal stenosis without significant cord abnormalities. Remote age lacunar infarct also noted in the right pons incidentally. Patient remains on Plavix tolerating this well. She has not had further stroke TIA symptoms. She continues to smoke but states she has cut back form 1 ppd previously to 1 pp every 3-4 days. She has some right ankle swelling, which is not painful, which she has not been evaluated for.  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 her niece also started paying her bill about a month ago, because she noticed sudden onset of right-hand weakness, difficulty sign her name with pen   She also report one-month history of acute onset worsening gait difficulty, she drags  her right leg some, she denies sensory change,   CT head without contrast showed periventricular small vessel disease, laboratory showed A1c 7.3, glucose 142, otherwise normal CMP, LDL 85, triglyceride 167   MRI of brain shows Mild perisylvian atrophy. Moderate periventricular and subcortical chronic small vessel ischemic disease.   Focal T1 hyperintensity in the left posterior putamen likely dystrophic mineralization. Left corona radiata DWI hyperintensity, isointense on ADC, hyperintense on T2 and T2FLAIR views, likely subacute-chronic ischemia, which could explain her sudden worsening right leg weakness, gait difficulty.   She continues to have mild gait difficulty, and is receiving physical therapy, which has improved, has made progress, she has been taking aspirin for a long time, lives alone at her house, she lives in her current house for more than 40 years now, has relatives check on her often, she quit driving few months ago, she is still smoking   Carotid doppler was negative for stenosis. 2Decho with grade 1 diastolic dysfunction and pulmonic valve with mild regurgitation otherwise normal. She is taking Plavix with out problems. She has not had further stroke symptoms.      REVIEW OF SYSTEMS: Full 14 system review of systems performed and notable only for: right ankle swelling, difficulty walking  ALLERGIES: Allergies  Allergen Reactions  . Penicillins     Facial edema    HOME MEDICATIONS: Outpatient Prescriptions Prior to Visit  Medication Sig Dispense Refill  . amLODipine (NORVASC) 5 MG tablet Take 5 mg by mouth daily.    . Canagliflozin (INVOKANA) 300 MG TABS Take 300 mg by mouth daily.    .Marland Kitchen  glimepiride (AMARYL) 1 MG tablet Take 1 mg by mouth daily with breakfast.     . IRON CR PO Take by mouth daily.    Marland Kitchen. lisinopril-hydrochlorothiazide (PRINZIDE,ZESTORETIC) 20-12.5 MG per tablet Take 1 tablet by mouth daily.    . metFORMIN (GLUMETZA) 1000 MG (MOD) 24 hr tablet Take 1,000 mg by  mouth 2 (two) times daily with a meal.    . clopidogrel (PLAVIX) 75 MG tablet Take 1 tablet (75 mg total) by mouth daily with breakfast. 90 tablet 1   No facility-administered medications prior to visit.    PHYSICAL EXAM Filed Vitals:   10/26/14 1140  BP: 150/67  Pulse: 70  Height: 5' 6.5" (1.689 m)  Weight: 195 lb 9.6 oz (88.724 kg)   Body mass index is 31.1 kg/(m^2).   MMSE - Mini Mental State Exam 10/26/2014  Orientation to time 5  Orientation to Place 5  Registration 3  Attention/ Calculation 5  Recall 2  Language- name 2 objects 2  Language- repeat 1  Language- follow 3 step command 3  Language- read & follow direction 1  Write a sentence 1  Copy design 0  Total score 28    Generalized: Well developed, in no acute distress, strong odor of cigareetes Head: normocephalic and atraumatic, Oropharynx benign   Neck: Supple, no carotid bruits   Cardiac: Regular rate rhythm, no murmur, mild RLE > LLE edema Musculoskeletal: No deformity   Neurological examination   Mentation: Alert oriented to time, place, history taking. MMSE 28/30. AFT 6. Follows all commands speech and language fluent  Cranial nerve II-XII: Pupils were equal round reactive to light extraocular movements were full, visual field were full on confrontational test. Facial sensation and strength were normal. hearing was intact to finger rubbing bilaterally. Uvula tongue midline. head turning and shoulder shrug were normal and symmetric.Tongue protrusion into cheek strength was normal. Motor: Limited range of motion to right shoulder and right upper extremity, otherwise  full strength in the BUE, BLE, fine finger movements normal, no pronator drift.   Sensory: normal and symmetric to light touch, pinprick, and  vibration   Coordination: finger-nose-finger, heel-to-shin bilaterally, no dysmetria Reflexes: 2+, upper  Lower and  Symmetric,  plantar responses were flexor bilaterally. Gait and Station: Rising up  from seated position without assistance, wide based cautious unsteady gait with cane.  ASSESSMENT: 80  year old female has a past medical history of Stroke (2013); Diabetes; Hypertension; CVA (cerebral infarction); Weakness; Gait disturbance; and Memory loss here to followup.  PLAN: Memory score is stable Continue Plavix daily, Will refill Continue home exercise program, given community resources for falls prevention classes. Consult with primary care/ortho for right foot swelling. Followup in 6 months with Dr. Terrace ArabiaYan, sooner as needed.  Meds ordered this encounter  Medications  . clopidogrel (PLAVIX) 75 MG tablet    Sig: Take 1 tablet (75 mg total) by mouth daily with breakfast.    Dispense:  90 tablet    Refill:  1    Order Specific Question:  Supervising Provider    Answer:  Joycelyn SchmidPENUMALLI, VIKRAM R [[id number]]   Tawny AsalLYNN E. Oriana Horiuchi, MSN, FNP-BC, A/GNP-C 10/26/2014, 12:09 PM [facility] [address], [address] [place], [place] [address] [phone]  Note: This document was prepared with digital dictation and possible smart phrase technology. Any transcriptional errors that result from this process are unintentional.

## 2014-10-30 ENCOUNTER — Encounter: Payer: Self-pay | Admitting: Neurology

## 2014-11-06 NOTE — Progress Notes (Signed)
I agree above plan. 

## 2015-04-26 ENCOUNTER — Ambulatory Visit: Payer: Medicare Other | Admitting: Neurology

## 2015-05-02 ENCOUNTER — Ambulatory Visit: Payer: Self-pay | Admitting: Neurology

## 2015-05-02 ENCOUNTER — Ambulatory Visit (INDEPENDENT_AMBULATORY_CARE_PROVIDER_SITE_OTHER): Payer: Medicare Other | Admitting: Neurology

## 2015-05-02 ENCOUNTER — Encounter: Payer: Self-pay | Admitting: Neurology

## 2015-05-02 VITALS — BP 167/87 | HR 74 | Ht 66.5 in | Wt 190.0 lb

## 2015-05-02 DIAGNOSIS — Z720 Tobacco use: Secondary | ICD-10-CM | POA: Diagnosis not present

## 2015-05-02 DIAGNOSIS — F17200 Nicotine dependence, unspecified, uncomplicated: Secondary | ICD-10-CM

## 2015-05-02 DIAGNOSIS — R269 Unspecified abnormalities of gait and mobility: Secondary | ICD-10-CM | POA: Diagnosis not present

## 2015-05-02 DIAGNOSIS — R413 Other amnesia: Secondary | ICD-10-CM

## 2015-05-02 NOTE — Patient Instructions (Signed)
Please contact your primary care doctor, and the pharmacist soon, to restart your diabetic medications  Return to clinic in 6 months

## 2015-05-02 NOTE — Progress Notes (Signed)
Chief Complaint  Patient presents with  . Gait Disturbance    She is able to slowly ambulate with the assistance of a cane.  . Memory Loss    MMSE 29/30 - 8 animals.  She feels her memory has started to decline.      PATIENT: Debra Skinner DOB: 1933/02/15  REASON FOR VISIT: routine follow up for memory loss and gait difficulty HISTORY FROM: patient  HISTORY OF PRESENT ILLNESS: .  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 her niece also started paying her bill about a month ago, because she noticed sudden onset of right-hand weakness, difficulty sign her name with pen   She also report one-month history of acute onset worsening gait difficulty, she drags her right leg some, she denies sensory change,    CT head without contrast showed periventricular small vessel disease, laboratory showed A1c 7.3, glucose 142, otherwise normal CMP, LDL 85, triglyceride 167    MRI of brain shows Mild perisylvian atrophy. Moderate periventricular and subcortical chronic small vessel ischemic disease.  focal T1 hyperintensity in the left posterior putamen likely dystrophic mineralization. Left corona radiata DWI hyperintensity, isointense on ADC, hyperintense on T2 and T2FLAIR views, likely subacute-chronic ischemia  She continues to have mild gait difficulty, and is receiving physical therapy, which has improved, has made progress, she has been taking aspirin for a long time, lives alone at her house, she lives in her current house for more than 40 years now, has relatives check on her often, she quit driving few months ago, she is still smoking   Carotid doppler was  negative for stenosis. 2Decho with grade 1 diastolic dysfunction and pulmonic valve with mild regurgitation otherwise normal. She is taking Plavix with out problems. She has not had further stroke symptoms.    MRI of the cervical spine 11/20/2013 showing prominent spondylitic changes most severe at C3-C4 where there is any leftsided osteophyte protrusion resulting in mild canal stenosis without significant cord abnormalities. Remote age lacunar infarct also noted in the right pons incidentally. Patient remains on Plavix tolerating this well. She has not had further stroke TIA symptoms. She continues to smoke but states she has cut back form 1 ppd previously to 1 pp every 3-4 days. She has some right ankle swelling, which is not painful, which she has not been evaluated for   UPDATE May 26th 2016: She is with her niece [name] at today's clinical visit, she has missed 3 of her diabetic medications for unknown reasons, only take hypertension medications, and Plavix,  She lives alone at her house, she lived at her current house since 1968, per [place], she smokes all day long, watches TV, not eating regularly, she has quit driving since 1610, mild unsteady gait, no bowel and bladder incontinence. She has no power of attorney, she has no children  REVIEW OF SYSTEMS: Full 14 system review of systems performed and notable only for: Incontinence of bladder ALLERGIES: Allergies  Allergen Reactions  . Penicillins     Facial edema    HOME MEDICATIONS: Outpatient Prescriptions Prior to Visit  Medication Sig Dispense Refill  . amLODipine (NORVASC) 5 MG tablet  Take 5 mg by mouth daily.    . clopidogrel (PLAVIX) 75 MG tablet Take 1 tablet (75 mg total) by mouth daily with breakfast. 90 tablet 1  . glimepiride (AMARYL) 1 MG tablet Take 1 mg by mouth daily with breakfast.     . lisinopril-hydrochlorothiazide (PRINZIDE,ZESTORETIC) 20-12.5 MG per tablet Take 1 tablet by mouth daily.    . metFORMIN (GLUMETZA)  1000 MG (MOD) 24 hr tablet Take 1,000 mg by mouth 2 (two) times daily with a meal.    . Canagliflozin (INVOKANA) 300 MG TABS Take 300 mg by mouth daily.    . IRON CR PO Take by mouth daily.     No facility-administered medications prior to visit.    PHYSICAL EXAM Filed Vitals:   05/02/15 1427  BP: 167/87  Pulse: 74  Height: 5' 6.5" (1.689 m)  Weight: 190 lb (86.183 kg)   Body mass index is 30.21 kg/(m^2).   MMSE - Mini Mental State Exam 05/02/2015 10/26/2014  Orientation to time 5 5  Orientation to Place 5 5  Registration 3 3  Attention/ Calculation 5 5  Recall 2 2  Language- name 2 objects 2 2  Language- repeat 1 1  Language- follow 3 step command 3 3  Language- read & follow direction 1 1  Write a sentence 1 1  Copy design 1 0  Total score 29 28   PHYSICAL EXAMNIATION:  Gen: NAD, conversant, well nourised, obese, well groomed                     Cardiovascular: Regular rate rhythm, no peripheral edema, warm, nontender. Eyes: Conjunctivae clear without exudates or hemorrhage Neck: Supple, no carotid bruise. Pulmonary: Clear to auscultation bilaterally   NEUROLOGICAL EXAM:  MENTAL STATUS: Speech:    Speech is normal; fluent and spontaneous with normal comprehension.  Cognition: MMSE 29/30, she missed 1/3 recalls. Animal naming 8  CRANIAL NERVES: CN II: Visual fields are full to confrontation. Fundoscopic exam is normal with sharp discs and no vascular changes. Pupils are 4 mm and briskly reactive to light.  CN III, IV, VI: extraocular movement are normal. No ptosis. CN V: Facial sensation is intact to pinprick in all 3 divisions bilaterally. Corneal responses are intact.  CN VII: Face is symmetric with normal eye closure and smile. CN VIII: Hearing is normal to rubbing fingers CN IX, X: Palate elevates symmetrically. Phonation is normal. CN XI: Head turning and shoulder shrug are intact CN XII: Tongue is midline with normal movements and no  atrophy.  MOTOR: There is no pronator drift of out-stretched arms. Muscle bulk and tone are normal. Muscle strength is normal.  REFLEXES: Reflexes are 2+ and symmetric at the biceps, triceps, knees, and ankles. Plantar responses are flexor.  SENSORY: Light touch, pinprick, position sense, and vibration sense are intact in fingers and toes.  COORDINATION: Rapid alternating movements and fine finger movements are intact. There is no dysmetria on finger-to-nose and heel-knee-shin. There are no abnormal or extraneous movements.   GAIT/STANCE: Need to push up from seated position, wide-based, cautious,  ASSESSMENT/PLAN: 81  year old female presenting with mild memory trouble, Mini-Mental Status Examination today is 57 out of 30, she has missed her diabetic medication for few months.  1. I have emphasized with her the importance of medical compliance, may reach out to family for help 2, I will hold off starting Namenda or Aricept at this point, with her difficulty managing her medications 3. Return to clinic  in 6 months with nurse practitioner   Levert FeinsteinYijun Canaan Holzer, M.D. Ph.D.  [facility] [address] [place], [place] [address] Phone: [phone] Fax:      [phone]

## 2015-11-04 ENCOUNTER — Telehealth: Payer: Self-pay | Admitting: *Deleted

## 2015-11-04 ENCOUNTER — Ambulatory Visit: Payer: Medicare Other | Admitting: Nurse Practitioner

## 2015-11-04 NOTE — Telephone Encounter (Signed)
I called and spoke to pt.   She could not make it due to her niece's schedule.  She could not find our number.  I gave her our number and she is to let her niece call back and reschedule.

## 2015-11-05 ENCOUNTER — Encounter: Payer: Self-pay | Admitting: Nurse Practitioner

## 2015-11-18 ENCOUNTER — Encounter: Payer: Self-pay | Admitting: Nurse Practitioner

## 2015-11-18 ENCOUNTER — Ambulatory Visit (INDEPENDENT_AMBULATORY_CARE_PROVIDER_SITE_OTHER): Payer: Medicare Other | Admitting: Nurse Practitioner

## 2015-11-18 VITALS — BP 157/72 | HR 74 | Ht 66.5 in | Wt 188.0 lb

## 2015-11-18 DIAGNOSIS — R269 Unspecified abnormalities of gait and mobility: Secondary | ICD-10-CM

## 2015-11-18 DIAGNOSIS — R413 Other amnesia: Secondary | ICD-10-CM

## 2015-11-18 MED ORDER — CLOPIDOGREL BISULFATE 75 MG PO TABS: 75.0000 mg | ORAL_TABLET | Freq: Every day | ORAL | Status: DC

## 2015-11-18 NOTE — Patient Instructions (Signed)
Memory score is stable Continue Plavix daily Will refill Continue home exercise program, F/U in 6 months

## 2015-11-18 NOTE — Progress Notes (Signed)
[facility]  PATIENT: Debra Skinner DOB: October 14, 1933   REASON FOR VISIT: Follow-up for gait disturbance, memory loss HISTORY FROM: Patient and niece    HISTORY OF PRESENT ILLNESS:(Initial visit Nov 2014): gradual onset memory loss, gait difficulty   She has past medical history of diabetes, recent diagnosis of hypertension, also has chronic left shoulder pain   She had college degree, majored in business, worked at [employer] for many years, retired in 1994, she widowed in 1985, lives alone, has no children, quit driving about a month ago. her niece also started paying her bill about a month ago, because she noticed sudden onset of right-hand weakness, difficulty sign her name with pen  She also report one-month history of acute onset worsening gait difficulty, she drags her right leg some, she denies sensory change,   CT head without contrast showed periventricular small vessel disease, laboratory showed A1c 7.3, glucose 142, otherwise normal CMP, LDL 85, triglyceride 167  MRI of brain shows Mild perisylvian atrophy. Moderate periventricular and subcortical chronic small vessel ischemic disease. focal T1 hyperintensity in the left posterior putamen likely dystrophic mineralization. Left corona radiata DWI hyperintensity, isointense on ADC, hyperintense on T2 and T2FLAIR views, likely subacute-chronic ischemia  She continues to have mild gait difficulty, and is receiving physical therapy, which has improved, has made progress, she has been taking aspirin for a long time, lives alone at her house, she lives in her current house for more than 40 years now, has relatives check on her often, she quit driving few months ago, she is still smoking  Carotid doppler was negative for stenosis. 2Decho with grade 1 diastolic dysfunction and pulmonic valve with mild regurgitation otherwise normal. She is taking Plavix with out problems. She has not had further stroke  symptoms.   MRI of the cervical spine 11/20/2013 showing prominent spondylitic changes most severe at C3-C4 where there is any leftsided osteophyte protrusion resulting in mild canal stenosis without significant cord abnormalities. Remote age lacunar infarct also noted in the right pons incidentally. Patient remains on Plavix tolerating this well. She has not had further stroke TIA symptoms. She continues to smoke but states she has cut back form 1 ppd previously to 1 pp every 3-4 days. She has some right ankle swelling, which is not painful, which she has not been evaluated for UPDATE May 26th 2016:She is with her niece [name] at today's clinical visit, she has missed 3 of her diabetic medications for unknown reasons, only take hypertension medications, and Plavix, She lives alone at her house, she lived at her current house since 1968, per [place], she smokes all day long, watches TV, not eating regularly, she has quit driving since 1610, mild unsteady gait, no bowel and bladder incontinence. She has no power of attorney, she has no children UPDATE 11/18/2015. Debra Skinner, 81 year old female returns for follow-up. She is accompanied by her niece. She has not taking some of her diabetic medications and her niece is not quite sure why. She is taking her Plavix. She has not had further stroke or TIA symptoms. She continues to live alone. She has not had any recent falls, there have  been no safety issues identified. She returns for reevaluation  REVIEW OF SYSTEMS: Full 14 system review of systems performed and notable only for those listed, all others are neg:  Constitutional: neg  Cardiovascular: neg Ear/Nose/Throat: neg  Skin: neg Eyes: neg Respiratory: neg Gastroitestinal: neg  Hematology/Lymphatic: neg  Endocrine: neg Musculoskeletal:neg  Allergy/Immunology: neg Neurological: neg Psychiatric: neg Sleep : neg   ALLERGIES: Allergies  Allergen Reactions  . Penicillins     Facial edema      HOME MEDICATIONS: Outpatient Prescriptions Prior to Visit  Medication Sig Dispense Refill  . amLODipine (NORVASC) 5 MG tablet Take 5 mg by mouth daily.    . clopidogrel (PLAVIX) 75 MG tablet Take 1 tablet (75 mg total) by mouth daily with breakfast. 90 tablet 1  . metFORMIN (GLUMETZA) 1000 MG (MOD) 24 hr tablet Take 1,000 mg by mouth 2 (two) times daily with a meal.    . canagliflozin (INVOKANA) 300 MG TABS tablet Take 300 mg by mouth daily before breakfast.    . glimepiride (AMARYL) 1 MG tablet Take 1 mg by mouth daily with breakfast.     . lisinopril-hydrochlorothiazide (PRINZIDE,ZESTORETIC) 20-12.5 MG per tablet Take 1 tablet by mouth daily.     No facility-administered medications prior to visit.    PAST MEDICAL HISTORY: Past Medical History  Diagnosis Date  . Stroke [facility]) 2013    "mini stroke"  . Diabetes (HCC)   . Hypertension   . CVA (cerebral infarction)   . Weakness   . Gait disturbance   . Memory loss     PAST SURGICAL HISTORY: Past Surgical History  Procedure Laterality Date  . Toe nail      FAMILY HISTORY: Family History  Problem Relation Age of Onset  . Cervical cancer Mother   . High blood pressure Father   . Aneurysm Father     SOCIAL HISTORY: Social History   Social History  . Marital Status: Widowed    Spouse Name: N/A  . Number of Children: 0  . Years of Education: college   Occupational History  .      Retired   Social History Main Topics  . Smoking status: Current Some Day Smoker -- 0.50 packs/day    Types: Cigarettes  . Smokeless tobacco: Never Used  . Alcohol Use: No  . Drug Use: No  . Sexual Activity: Not on file   Other Topics Concern  . Not on file   Social History Narrative   Patient is retired and lives at   Home alone.    Education- College    Right handed.   Caffeine-  Coffee one daily, soda-  And tea daily.                 PHYSICAL EXAM  Filed Vitals:   11/18/15 1418  BP: 157/72  Pulse: 74  Height:  5' 6.5" (1.689 m)  Weight: 188 lb (85.276 kg)   Body mass index is 29.89 kg/(m^2).  Generalized: Well developed, in no acute distress  Head: normocephalic and atraumatic,. Oropharynx benign  Neck: Supple, no carotid bruits  Cardiac: Regular rate rhythm, no murmur  Musculoskeletal: No deformity   Neurological examination   Mentation: Alert oriented to time, place, history taking.MMSE 28/30. AFT 6. Clock drawing 4/4.  Attention span and concentration appropriate.   Follows all commands speech and language fluent.   Cranial nerve II-XII: Pupils were equal round reactive to light extraocular movements were full, visual field were full on confrontational test. Facial sensation and strength were normal. hearing was intact to finger rubbing bilaterally. Uvula tongue midline. head turning and shoulder shrug were normal and symmetric.Tongue protrusion into cheek strength was normal. Motor: normal bulk and tone, full strength in the BUE, BLE, fine finger movements normal, no pronator drift. No focal weakness Sensory: normal and  symmetric to light touch, pinprick, and  Vibration, proprioception  Coordination: finger-nose-finger, heel-to-shin bilaterally, no dysmetria Reflexes: Brachioradialis 2/2, biceps 2/2, triceps 2/2, patellar 2/2, Achilles 2/2, plantar responses were flexor bilaterally. Gait and Station: Rising up from seated position with pushoff , wide based cautious gait ambulates with single-point cane  DIAGNOSTIC DATA (LABS, IMAGING, TESTING) - ASSESSMENT AND PLAN  81 y.o. year old female  has a past medical history of Stroke (HCC) (2013); Diabetes (HCC); Hypertension; CVA (cerebral infarction); Weakness; Gait disturbance; and Memory loss. here to follow-up. Her memory score is stable she remains on Plavix for secondary stroke prevention  Continue Plavix daily Will refill Continue home exercise program, F/U in 6 months Nilda RiggsNancy Carolyn Sarinah Doetsch, [facility] PointGNP, [facility] CenterBC, APRN  [facility] [address], [address] [place], [place] [id number] [phone]

## 2015-11-19 NOTE — Progress Notes (Signed)
I have reviewed and agreed above plan. 

## 2016-05-19 ENCOUNTER — Encounter: Payer: Self-pay | Admitting: Nurse Practitioner

## 2016-05-19 ENCOUNTER — Ambulatory Visit (INDEPENDENT_AMBULATORY_CARE_PROVIDER_SITE_OTHER): Payer: Medicare Other | Admitting: Nurse Practitioner

## 2016-05-19 VITALS — BP 138/65 | HR 72 | Ht 66.5 in | Wt 187.6 lb

## 2016-05-19 DIAGNOSIS — I10 Essential (primary) hypertension: Secondary | ICD-10-CM

## 2016-05-19 DIAGNOSIS — R269 Unspecified abnormalities of gait and mobility: Secondary | ICD-10-CM | POA: Diagnosis not present

## 2016-05-19 DIAGNOSIS — R413 Other amnesia: Secondary | ICD-10-CM

## 2016-05-19 MED ORDER — CLOPIDOGREL BISULFATE 75 MG PO TABS: 75.0000 mg | ORAL_TABLET | Freq: Every day | ORAL | Status: DC

## 2016-05-19 NOTE — Patient Instructions (Signed)
Continue Plavix daily Will refill Blood pressure 138/65 continue Norvasc Stop sodas stay well hydrated with water Stop smoking Continue home exercise program, F/U in 6 months

## 2016-05-19 NOTE — Progress Notes (Signed)
[facility]  PATIENT: Debra Skinner DOB: 09/14/1933   REASON FOR VISIT: Follow-up for memory loss gait abnormality, history of stroke HISTORY FROM: Patient and niece    HISTORY OF PRESENT ILLNESS:(Initial visit Nov 2014):YY gradual onset memory loss, gait difficulty  She has past medical history of diabetes, recent diagnosis of hypertension, also has chronic left shoulder pain  She had college degree, majored in business, worked at [employer] for many years, retired in 1994, she widowed in 1985, lives alone, has no children, quit driving about a month ago. her niece also started paying her bill about a month ago, because she noticed sudden onset of right-hand weakness, difficulty sign her name with pen  She also report one-month history of acute onset worsening gait difficulty, she drags her right leg some, she denies sensory change,   CT head without contrast showed periventricular small vessel disease, laboratory showed A1c 7.3, glucose 142, otherwise normal CMP, LDL 85, triglyceride 167 MRI of brain shows Mild perisylvian atrophy. Moderate periventricular and subcortical chronic small vessel ischemic disease. focal T1 hyperintensity in the left posterior putamen likely dystrophic mineralization. Left corona radiata DWI hyperintensity, isointense on ADC, hyperintense on T2 and T2FLAIR views, likely subacute-chronic ischemia  She continues to have mild gait difficulty, and is receiving physical therapy, which has improved, has made progress, she has been taking aspirin for a long time, lives alone at her house, she lives in her current house for more than 40 years now, has relatives check on her often, she quit driving few months ago, she is still smoking  Carotid doppler was negative for stenosis. 2Decho with grade 1 diastolic dysfunction and pulmonic valve with mild regurgitation otherwise normal. She is taking Plavix with out problems. She has not had  further stroke symptoms.   MRI of the cervical spine 11/20/2013 showing prominent spondylitic changes most severe at C3-C4 where there is any leftsided osteophyte protrusion resulting in mild canal stenosis without significant cord abnormalities. Remote age lacunar infarct also noted in the right pons incidentally. Patient remains on Plavix tolerating this well. She has not had further stroke TIA symptoms. She continues to smoke but states she has cut back form 1 ppd previously to 1 pp every 3-4 days. She has some right ankle swelling, which is not painful, which she has not been evaluated for UPDATE May 26th 2016:She is with her niece Lelon MastSamantha at today's clinical visit, she has missed 3 of her diabetic medications for unknown reasons, only take hypertension medications, and Plavix, She lives alone at her house, she lived at her current house since 1968, per VaughnSamantha, she smokes all day long, watches TV, not eating regularly, she has quit driving since 16102015, mild unsteady gait, no bowel and bladder incontinence. She has no power of attorney, she has no children UPDATE 11/18/2015.CM [name], 82 year old female returns for follow-up. She is accompanied by her niece. She has not taking some of her diabetic medications and her niece is not quite sure why. She is taking her Plavix. She has not had further stroke or TIA symptoms. She continues to live alone. She has not had any recent falls, there have been no safety issues identified. She returns for reevaluation UPDATE 06/13/2017CM [name], 82 year old female returns for follow-up with her niece. She has a history of gait abnormality in 2014 where she was dragging her leg. She was placed on Plavix and has not had further stroke or TIA symptoms she continues to live alone.  No safety issues no recent falls. Memory is stable. Metformin has been discontinued and she is now on Amaryl. She continues to smoke and drink soda's. No regular exercise she returns for  reevaluation  REVIEW OF SYSTEMS: Full 14 system review of systems performed and notable only for those listed, all others are neg:  Constitutional: neg  Cardiovascular: neg Ear/Nose/Throat: neg  Skin: neg Eyes: Blurred vision Respiratory: neg Gastroitestinal: Urinary frequency Hematology/Lymphatic: neg  Endocrine: neg Musculoskeletal:neg Allergy/Immunology: neg Neurological: neg Psychiatric: neg Sleep : neg   ALLERGIES: Allergies  Allergen Reactions  . Penicillins     Facial edema    HOME MEDICATIONS: Outpatient Prescriptions Prior to Visit  Medication Sig Dispense Refill  . amLODipine (NORVASC) 5 MG tablet Take 5 mg by mouth daily.    . canagliflozin (INVOKANA) 300 MG TABS tablet Take 300 mg by mouth daily before breakfast.    . clopidogrel (PLAVIX) 75 MG tablet Take 1 tablet (75 mg total) by mouth daily with breakfast. 90 tablet 1  . glimepiride (AMARYL) 1 MG tablet Take 4 mg by mouth daily with breakfast.     . lisinopril-hydrochlorothiazide (PRINZIDE,ZESTORETIC) 20-12.5 MG per tablet Take 1 tablet by mouth daily.    . metFORMIN (GLUMETZA) 1000 MG (MOD) 24 hr tablet Take 1,000 mg by mouth 2 (two) times daily with a meal.     No facility-administered medications prior to visit.    PAST MEDICAL HISTORY: Past Medical History  Diagnosis Date  . Stroke [facility]) 2013    "mini stroke"  . Diabetes (HCC)   . Hypertension   . CVA (cerebral infarction)   . Weakness   . Gait disturbance   . Memory loss     PAST SURGICAL HISTORY: Past Surgical History  Procedure Laterality Date  . Toe nail      FAMILY HISTORY: Family History  Problem Relation Age of Onset  . Cervical cancer Mother   . High blood pressure Father   . Aneurysm Father     SOCIAL HISTORY: Social History   Social History  . Marital Status: Widowed    Spouse Name: N/A  . Number of Children: 0  . Years of Education: college   Occupational History  .      Retired   Social History Main Topics    . Smoking status: Current Some Day Smoker -- 0.50 packs/day    Types: Cigarettes  . Smokeless tobacco: Never Used  . Alcohol Use: No  . Drug Use: No  . Sexual Activity: Not on file   Other Topics Concern  . Not on file   Social History Narrative   Patient is retired and lives at   Home alone.    Education- College    Right handed.   Caffeine-  Coffee one daily, soda-  And tea daily.                 PHYSICAL EXAM  Filed Vitals:   05/19/16 1331  BP: 138/65  Pulse: 72  Height: 5' 6.5" (1.689 m)  Weight: 187 lb 9.6 oz (85.095 kg)   Body mass index is 29.83 kg/(m^2). Generalized: Well developed, in no acute distress  Head: normocephalic and atraumatic,. Oropharynx benign  Neck: Supple, no carotid bruits  Cardiac: Regular rate rhythm, no murmur  Musculoskeletal: No deformity   Neurological examination   Mentation: Alert oriented to time, place, history taking.MMSE 27/30. AFT 3. Clock drawing 4/4. Attention span and concentration appropriate. Follows all commands speech and language fluent.  Cranial nerve II-XII: Pupils were equal round reactive to light extraocular movements were full, visual field were full on confrontational test. Facial sensation and strength were normal. hearing was intact to finger rubbing bilaterally. Uvula tongue midline. head turning and shoulder shrug were normal and symmetric.Tongue protrusion into cheek strength was normal. Motor: normal bulk and tone, full strength in the BUE, BLE, fine finger movements normal, no pronator drift. No focal weakness Sensory: normal and symmetric to light touch, pinprick, and Vibration, proprioception  Coordination: finger-nose-finger, heel-to-shin bilaterally, no dysmetria Reflexes: Brachioradialis 2/2, biceps 2/2, triceps 2/2, patellar 2/2, Achilles 2/2, plantar responses were flexor bilaterally. Gait and Station: Rising up from seated position with pushoff , wide based cautious gait ambulates with  single-point cane   DIAGNOSTIC DATA (LABS, IMAGING, TESTING) - ASSESSMENT AND PLAN 82 y.o. year old female has a past medical history of Stroke (HCC) (2013); Diabetes (HCC); Hypertension; CVA (cerebral infarction); Weakness; Gait disturbance; and Memory loss. here to follow-up. Her memory score is stable she remains on Plavix for secondary stroke prevention  Continue Plavix daily Will refill Blood pressure 138/65 continue Norvasc Stop sodas stay well hydrated with water Stop smoking Continue home exercise program, F/U in 6 months Nilda Riggs, [facility], [facility], APRN  [facility] [address] [place], [place] [address] [phone]

## 2016-05-26 NOTE — Progress Notes (Signed)
I have reviewed and agreed above plan. 

## 2016-06-03 DIAGNOSIS — E119 Type 2 diabetes mellitus without complications: Secondary | ICD-10-CM | POA: Diagnosis not present

## 2016-06-10 DIAGNOSIS — E118 Type 2 diabetes mellitus with unspecified complications: Secondary | ICD-10-CM | POA: Diagnosis not present

## 2016-06-10 DIAGNOSIS — H538 Other visual disturbances: Secondary | ICD-10-CM | POA: Diagnosis not present

## 2016-06-10 DIAGNOSIS — I10 Essential (primary) hypertension: Secondary | ICD-10-CM | POA: Diagnosis not present

## 2016-06-10 DIAGNOSIS — I2510 Atherosclerotic heart disease of native coronary artery without angina pectoris: Secondary | ICD-10-CM | POA: Diagnosis not present

## 2016-07-08 DIAGNOSIS — H25012 Cortical age-related cataract, left eye: Secondary | ICD-10-CM | POA: Diagnosis not present

## 2016-07-08 DIAGNOSIS — H25011 Cortical age-related cataract, right eye: Secondary | ICD-10-CM | POA: Diagnosis not present

## 2016-07-08 DIAGNOSIS — H25042 Posterior subcapsular polar age-related cataract, left eye: Secondary | ICD-10-CM | POA: Diagnosis not present

## 2016-07-08 DIAGNOSIS — H2512 Age-related nuclear cataract, left eye: Secondary | ICD-10-CM | POA: Diagnosis not present

## 2016-07-08 DIAGNOSIS — H35033 Hypertensive retinopathy, bilateral: Secondary | ICD-10-CM | POA: Diagnosis not present

## 2016-08-24 DIAGNOSIS — H2512 Age-related nuclear cataract, left eye: Secondary | ICD-10-CM | POA: Diagnosis not present

## 2016-08-24 DIAGNOSIS — H25042 Posterior subcapsular polar age-related cataract, left eye: Secondary | ICD-10-CM | POA: Diagnosis not present

## 2016-08-24 DIAGNOSIS — H2511 Age-related nuclear cataract, right eye: Secondary | ICD-10-CM | POA: Diagnosis not present

## 2016-08-24 DIAGNOSIS — H269 Unspecified cataract: Secondary | ICD-10-CM | POA: Diagnosis not present

## 2016-08-24 DIAGNOSIS — H25012 Cortical age-related cataract, left eye: Secondary | ICD-10-CM | POA: Diagnosis not present

## 2016-08-24 DIAGNOSIS — H25011 Cortical age-related cataract, right eye: Secondary | ICD-10-CM | POA: Diagnosis not present

## 2016-10-07 DIAGNOSIS — H25812 Combined forms of age-related cataract, left eye: Secondary | ICD-10-CM | POA: Diagnosis not present

## 2016-10-07 DIAGNOSIS — H25042 Posterior subcapsular polar age-related cataract, left eye: Secondary | ICD-10-CM | POA: Diagnosis not present

## 2016-10-07 DIAGNOSIS — H2512 Age-related nuclear cataract, left eye: Secondary | ICD-10-CM | POA: Diagnosis not present

## 2016-10-07 DIAGNOSIS — H2522 Age-related cataract, morgagnian type, left eye: Secondary | ICD-10-CM | POA: Diagnosis not present

## 2016-10-07 DIAGNOSIS — H21562 Pupillary abnormality, left eye: Secondary | ICD-10-CM | POA: Diagnosis not present

## 2016-11-24 ENCOUNTER — Ambulatory Visit (INDEPENDENT_AMBULATORY_CARE_PROVIDER_SITE_OTHER): Payer: Medicare Other | Admitting: Nurse Practitioner

## 2016-11-24 ENCOUNTER — Encounter: Payer: Self-pay | Admitting: Nurse Practitioner

## 2016-11-24 VITALS — BP 129/66 | HR 77 | Ht 66.5 in | Wt 187.8 lb

## 2016-11-24 DIAGNOSIS — I10 Essential (primary) hypertension: Secondary | ICD-10-CM

## 2016-11-24 DIAGNOSIS — Z8673 Personal history of transient ischemic attack (TIA), and cerebral infarction without residual deficits: Secondary | ICD-10-CM | POA: Insufficient documentation

## 2016-11-24 DIAGNOSIS — R269 Unspecified abnormalities of gait and mobility: Secondary | ICD-10-CM

## 2016-11-24 DIAGNOSIS — I6381 Other cerebral infarction due to occlusion or stenosis of small artery: Secondary | ICD-10-CM | POA: Insufficient documentation

## 2016-11-24 DIAGNOSIS — R413 Other amnesia: Secondary | ICD-10-CM | POA: Diagnosis not present

## 2016-11-24 MED ORDER — CLOPIDOGREL BISULFATE 75 MG PO TABS: 75.0000 mg | ORAL_TABLET | Freq: Every day | ORAL | 3 refills | Status: AC

## 2016-11-24 NOTE — Progress Notes (Signed)
I have reviewed and agreed above plan. 

## 2016-11-24 NOTE — Patient Instructions (Addendum)
Continue Plavix daily Will refill Blood pressure 129/66 continue Norvasc Stay well hydrated with water Stop smoking Continue home exercise program,use cane for safe ambulation F/U in 6 months next with Dr. Terrace ArabiaYan

## 2016-11-24 NOTE — Progress Notes (Signed)
[facility]  PATIENT: Debra Skinner DOB: 09/14/1933   REASON FOR VISIT: Follow-up for memory loss gait abnormality, history of stroke HISTORY FROM: Patient and niece    HISTORY OF PRESENT ILLNESS:(Initial visit Nov 2014):YY gradual onset memory loss, gait difficulty  She has past medical history of diabetes, recent diagnosis of hypertension, also has chronic left shoulder pain  She had college degree, majored in business, worked at [employer] human [employer] for many years, retired in 1994, she widowed in 1985, lives alone, has no children, quit driving about a month ago. her niece also started paying her bill about a month ago, because she noticed sudden onset of right-hand weakness, difficulty sign her name with pen  She also report one-month history of acute onset worsening gait difficulty, she drags her right leg some, she denies sensory change,   CT head without contrast showed periventricular small vessel disease, laboratory showed A1c 7.3, glucose 142, otherwise normal CMP, LDL 85, triglyceride 167 MRI of brain shows Mild perisylvian atrophy. Moderate periventricular and subcortical chronic small vessel ischemic disease. focal T1 hyperintensity in the left posterior putamen likely dystrophic mineralization. Left corona radiata DWI hyperintensity, isointense on ADC, hyperintense on T2 and T2FLAIR views, likely subacute-chronic ischemia  She continues to have mild gait difficulty, and is receiving physical therapy, which has improved, has made progress, she has been taking aspirin for a long time, lives alone at her house, she lives in her current house for more than 40 years now, has relatives check on her often, she quit driving few months ago, she is still smoking  Carotid doppler was negative for stenosis. 2Decho with grade 1 diastolic dysfunction and pulmonic valve with mild regurgitation otherwise normal. She is taking Plavix with out problems. She has not had  further stroke symptoms.   MRI of the cervical spine 11/20/2013 showing prominent spondylitic changes most severe at C3-C4 where there is any leftsided osteophyte protrusion resulting in mild canal stenosis without significant cord abnormalities. Remote age lacunar infarct also noted in the right pons incidentally. Patient remains on Plavix tolerating this well. She has not had further stroke TIA symptoms. She continues to smoke but states she has cut back form 1 ppd previously to 1 pp every 3-4 days. She has some right ankle swelling, which is not painful, which she has not been evaluated for UPDATE May 26th 2016:She is with her niece Lelon MastSamantha at today's clinical visit, she has missed 3 of her diabetic medications for unknown reasons, only take hypertension medications, and Plavix, She lives alone at her house, she lived at her current house since 1968, per Debra Skinner, she smokes all day long, watches TV, not eating regularly, she has quit driving since [id number], mild unsteady gait, no bowel and bladder incontinence. She has no power of attorney, she has no children UPDATE 11/18/2015.CM Debra Skinner, 82 year old female returns for follow-up. She is accompanied by her niece. She has not taking some of her diabetic medications and her niece is not quite sure why. She is taking her Plavix. She has not had further stroke or TIA symptoms. She continues to live alone. She has not had any recent falls, there have been no safety issues identified. She returns for reevaluation UPDATE 06/13/2017CM Debra Skinner, 82 year old female returns for follow-up with her niece. She has a history of gait abnormality in 2014 where she was dragging her leg. She was placed on Plavix and has not had further stroke or TIA symptoms she continues to live alone.  No safety issues no recent falls. Memory is stable. Metformin has been discontinued and she is now on Amaryl. She continues to smoke and drink soda's. No regular exercise she returns for  reevaluation UPDATE 12/19/2017CM Debra Skinner, 82 year old female returns for follow-up with her niece with history of gait abnormality, lacunar infarct and mild memory loss. She remains on Plavix for secondary stroke prevention without further stroke or TIA symptoms she has minimal bruising and no bleeding. She continues to live alone without safety issues identified her memory is stable she continues to smoke even though she has been encouraged to stop she gets no regular exercise. No recent falls. She ambulates with a single-point cane She returns for reevaluation   REVIEW OF SYSTEMS: Full 14 system review of systems performed and notable only for those listed, all others are neg:  Constitutional: neg  Cardiovascular: neg Ear/Nose/Throat: neg  Skin: neg Eyes: Blurred vision Respiratory: neg Gastroitestinal: Urinary frequency Hematology/Lymphatic: neg  Endocrine: neg Musculoskeletal:neg Allergy/Immunology: neg Neurological: neg Psychiatric: neg Sleep : neg   ALLERGIES: Allergies  Allergen Reactions  . Penicillins     Facial edema    HOME MEDICATIONS: Outpatient Medications Prior to Visit  Medication Sig Dispense Refill  . amLODipine (NORVASC) 5 MG tablet Take 5 mg by mouth daily.    . clopidogrel (PLAVIX) 75 MG tablet Take 1 tablet (75 mg total) by mouth daily with breakfast. 90 tablet 1  . glimepiride (AMARYL) 1 MG tablet Take 4 mg by mouth daily with breakfast.     . lisinopril-hydrochlorothiazide (PRINZIDE,ZESTORETIC) 20-12.5 MG per tablet Take 1 tablet by mouth daily.    . canagliflozin (INVOKANA) 300 MG TABS tablet Take 300 mg by mouth daily before breakfast.     No facility-administered medications prior to visit.     PAST MEDICAL HISTORY: Past Medical History:  Diagnosis Date  . CVA (cerebral infarction)   . Diabetes (HCC)   . Gait disturbance   . Hypertension   . Memory loss   . Stroke [facility](HCC) 2013   "mini stroke"  . Weakness     PAST SURGICAL HISTORY: Past  Surgical History:  Procedure Laterality Date  . CATARACT EXTRACTION Left    10-07-16  . toe nail      FAMILY HISTORY: Family History  Problem Relation Age of Onset  . Cervical cancer Mother   . High blood pressure Father   . Aneurysm Father     SOCIAL HISTORY: Social History   Social History  . Marital status: Widowed    Spouse name: N/A  . Number of children: 0  . Years of education: college   Occupational History  .  Retired    Retired   Social History Main Topics  . Smoking status: Current Some Day Smoker    Packs/day: 0.50    Types: Cigarettes  . Smokeless tobacco: Never Used  . Alcohol use No  . Drug use: No  . Sexual activity: Not on file   Other Topics Concern  . Not on file   Social History Narrative   Patient is retired and lives at   Home alone.    Education- College    Right handed.   Caffeine-  Coffee one daily, soda-  And tea daily.                 PHYSICAL EXAM  Vitals:   11/24/16 1239  BP: 129/66  Pulse: 77  Weight: 187 lb 12.8 oz (85.2 kg)  Height: 5' 6.5" (1.689  m)   Body mass index is 29.86 kg/m. Generalized: Well developed, in no acute distress  Head: normocephalic and atraumatic,. Oropharynx benign  Neck: Supple, no carotid bruits  Cardiac: Regular rate rhythm, no murmur  Musculoskeletal: No deformity   Neurological examination   Mentation: Alert oriented to time, place, history taking.MMSE 25/30. AFT5. Clock drawing 4/4. Attention span and concentration appropriate. Follows all commands speech and language fluent.   Cranial nerve II-XII: Pupils were equal round reactive to light extraocular movements were full, visual field were full on confrontational test. Facial sensation and strength were normal. hearing was intact to finger rubbing bilaterally. Uvula tongue midline. head turning and shoulder shrug were normal and symmetric.Tongue protrusion into cheek strength was normal. Motor: normal bulk and tone, full  strength in the BUE, BLE, fine finger movements normal, no pronator drift. No focal weakness Sensory: normal and symmetric to light touch, pinprick, and Vibratioin the upper and lower extremities Coordination  finger-nose-finger, heel-to-shin bilaterally, no dysmetria ReflexesSymmetric upper and lowerantar responses were flexor bilaterally. Gait and Station: Rising up from seated position with pushoff , wide based cautious gait ambulates with single-point cane   DIAGNOSTIC DATA (LABS, IMAGING, TESTING) - ASSESSMENT AND PLAN 82 y.o. year old female has a past medical history of Stroke (HCC) (2013); Diabetes (HCC); Hypertension; CVA (cerebral infarction); Weakness; Gait disturbance; and Memory loss. here to follow-up. Her memory score is stable she remains on Plavix for secondary stroke prevention  Continue Plavix daily Will refill Blood pressure 129/66 continue Norvasc Stay well hydrated with water Stop smoking Continue home exercise program,use cane for safe ambulation F/U in 6 months next with Dr. Carmelina Noun, [facility], [facility], APRN  [facility] [address] [place], [place] [address] [phone]

## 2016-12-28 DIAGNOSIS — Z1231 Encounter for screening mammogram for malignant neoplasm of breast: Secondary | ICD-10-CM | POA: Diagnosis not present

## 2017-01-05 DIAGNOSIS — Z0000 Encounter for general adult medical examination without abnormal findings: Secondary | ICD-10-CM | POA: Diagnosis not present

## 2017-01-05 DIAGNOSIS — Z780 Asymptomatic menopausal state: Secondary | ICD-10-CM | POA: Diagnosis not present

## 2017-01-05 DIAGNOSIS — N951 Menopausal and female climacteric states: Secondary | ICD-10-CM | POA: Diagnosis not present

## 2017-01-05 DIAGNOSIS — D649 Anemia, unspecified: Secondary | ICD-10-CM | POA: Diagnosis not present

## 2017-01-05 DIAGNOSIS — I10 Essential (primary) hypertension: Secondary | ICD-10-CM | POA: Diagnosis not present

## 2017-01-05 DIAGNOSIS — I2510 Atherosclerotic heart disease of native coronary artery without angina pectoris: Secondary | ICD-10-CM | POA: Diagnosis not present

## 2017-01-05 DIAGNOSIS — E118 Type 2 diabetes mellitus with unspecified complications: Secondary | ICD-10-CM | POA: Diagnosis not present

## 2017-01-13 DIAGNOSIS — D500 Iron deficiency anemia secondary to blood loss (chronic): Secondary | ICD-10-CM | POA: Diagnosis not present

## 2017-01-13 DIAGNOSIS — E782 Mixed hyperlipidemia: Secondary | ICD-10-CM | POA: Diagnosis not present

## 2017-01-13 DIAGNOSIS — E118 Type 2 diabetes mellitus with unspecified complications: Secondary | ICD-10-CM | POA: Diagnosis not present

## 2017-01-13 DIAGNOSIS — Z23 Encounter for immunization: Secondary | ICD-10-CM | POA: Diagnosis not present

## 2017-01-13 DIAGNOSIS — I2510 Atherosclerotic heart disease of native coronary artery without angina pectoris: Secondary | ICD-10-CM | POA: Diagnosis not present

## 2017-01-21 DIAGNOSIS — L602 Onychogryphosis: Secondary | ICD-10-CM | POA: Diagnosis not present

## 2017-01-21 DIAGNOSIS — M79671 Pain in right foot: Secondary | ICD-10-CM | POA: Diagnosis not present

## 2017-01-21 DIAGNOSIS — M79672 Pain in left foot: Secondary | ICD-10-CM | POA: Diagnosis not present

## 2017-01-21 DIAGNOSIS — E1351 Other specified diabetes mellitus with diabetic peripheral angiopathy without gangrene: Secondary | ICD-10-CM | POA: Diagnosis not present

## 2017-05-18 DIAGNOSIS — D500 Iron deficiency anemia secondary to blood loss (chronic): Secondary | ICD-10-CM | POA: Diagnosis not present

## 2017-05-18 DIAGNOSIS — E118 Type 2 diabetes mellitus with unspecified complications: Secondary | ICD-10-CM | POA: Diagnosis not present

## 2017-05-25 ENCOUNTER — Ambulatory Visit (INDEPENDENT_AMBULATORY_CARE_PROVIDER_SITE_OTHER): Payer: Medicare Other | Admitting: Neurology

## 2017-05-25 ENCOUNTER — Encounter: Payer: Self-pay | Admitting: Neurology

## 2017-05-25 VITALS — BP 142/59 | HR 73 | Ht 66.5 in | Wt 193.5 lb

## 2017-05-25 DIAGNOSIS — Z8673 Personal history of transient ischemic attack (TIA), and cerebral infarction without residual deficits: Secondary | ICD-10-CM | POA: Diagnosis not present

## 2017-05-25 DIAGNOSIS — R269 Unspecified abnormalities of gait and mobility: Secondary | ICD-10-CM

## 2017-05-25 DIAGNOSIS — R413 Other amnesia: Secondary | ICD-10-CM

## 2017-05-25 NOTE — Progress Notes (Signed)
[facility]  PATIENT: Debra Skinner DOB: 02/27/1933   HISTORY OF PRESENT ILLNESS:(Initial visit Nov 2014): gradual onset memory loss, gait difficulty  She has past medical history of diabetes, recent diagnosis of hypertension, also has chronic left shoulder pain  She had college degree, majored in business, worked at [employer] for many years, retired in 1994, she widowed in 1985, lives alone, has no children, quit driving about a month ago. her niece also started paying her bill about a month ago, because she noticed sudden onset of right-hand weakness, difficulty sign her name with pen  She also report one-month history of acute onset worsening gait difficulty, she drags her right leg some, she denies sensory change,   CT head without contrast showed periventricular small vessel disease, laboratory showed A1c 7.3, glucose 142, otherwise normal CMP, LDL 85, triglyceride 167  MRI of brain shows Mild perisylvian atrophy. Moderate periventricular and subcortical chronic small vessel ischemic disease. focal T1 hyperintensity in the left posterior putamen likely dystrophic mineralization. Left corona radiata DWI hyperintensity, isointense on ADC, hyperintense on T2 and T2FLAIR views, likely subacute-chronic ischemia  She continues to have mild gait difficulty, and is receiving physical therapy, which has improved, has made progress, she has been taking aspirin for a long time, lives alone at her house, she lives in her current house for more than 40 years now, has relatives check on her often, she quit driving few months ago, she is still smoking  Carotid doppler was negative for stenosis. 2Decho with grade 1 diastolic dysfunction and pulmonic valve with mild regurgitation otherwise normal. She is taking Plavix with out problems. She has not had further stroke symptoms.   MRI of the cervical spine 11/20/2013 showing prominent spondylitic changes most severe at  C3-C4 where there is any leftsided osteophyte protrusion resulting in mild canal stenosis without significant cord abnormalities. Remote age lacunar infarct also noted in the right pons incidentally. Patient remains on Plavix tolerating this well. She has not had further stroke TIA symptoms. She continues to smoke but states she has cut back form 1 ppd previously to 1 pp every 3-4 days. She has some right ankle swelling, which is not painful, which she has not been evaluated for  UPDATE May 26th 2016:She is with her niece Debra Skinner at today's clinical visit, she has missed 3 of her diabetic medications for unknown reasons, only take hypertension medications, and Plavix, She lives alone at her house, she lived at her current house since 1968, per [place], she smokes all day long, watches TV, not eating regularly, she has quit driving since 2956, mild unsteady gait, no bowel and bladder incontinence. She has no power of attorney, she has no children  UPDATE May 25 2017: She lives by herself in her house, she has been lifting her house since 1968, she ambulate with a 4 foot cane, she is no longer driving,  She denies significant pain, she eats well, sleeps well.    REVIEW OF SYSTEMS: Full 14 system review of systems performed and notable only for those listed, all others are neg:      ALLERGIES: Allergies  Allergen Reactions  . Penicillins     Facial edema    HOME MEDICATIONS: Outpatient Medications Prior to Visit  Medication Sig Dispense Refill  . amLODipine (NORVASC) 5 MG tablet Take 5 mg by mouth daily.    . clopidogrel (PLAVIX) 75 MG tablet Take 1 tablet (75 mg total) by mouth daily with breakfast. 90  tablet 3  . lisinopril-hydrochlorothiazide (PRINZIDE,ZESTORETIC) 20-12.5 MG per tablet Take 1 tablet by mouth daily.    Marland Kitchen. glimepiride (AMARYL) 1 MG tablet Take 4 mg by mouth daily with breakfast.     . prednisoLONE acetate (PRED FORTE) 1 % ophthalmic suspension INSTILL 1 DROP IN LEFT EYE  FOUR TIMES A DAY START AFTER SURGERY  1   No facility-administered medications prior to visit.     PAST MEDICAL HISTORY: Past Medical History:  Diagnosis Date  . CVA (cerebral infarction)   . Diabetes (HCC)   . Gait disturbance   . Hypertension   . Memory loss   . Stroke [facility](HCC) 2013   "mini stroke"  . Weakness     PAST SURGICAL HISTORY: Past Surgical History:  Procedure Laterality Date  . CATARACT EXTRACTION Left    10-07-16  . toe nail      FAMILY HISTORY: Family History  Problem Relation Age of Onset  . Cervical cancer Mother   . High blood pressure Father   . Aneurysm Father     SOCIAL HISTORY: Social History   Social History  . Marital status: Widowed    Spouse name: N/A  . Number of children: 0  . Years of education: college   Occupational History  .  Retired    Retired   Social History Main Topics  . Smoking status: Current Some Day Smoker    Packs/day: 0.50    Types: Cigarettes  . Smokeless tobacco: Never Used  . Alcohol use No  . Drug use: No  . Sexual activity: Not on file   Other Topics Concern  . Not on file   Social History Narrative   Patient is retired and lives at   Home alone.    Education- College    Right handed.   Caffeine-  Coffee one daily, soda-  And tea daily.                 PHYSICAL EXAM  Vitals:   05/25/17 1305  BP: (!) 142/59  Pulse: 73  Weight: 193 lb 8 oz (87.8 kg)  Height: 5' 6.5" (1.689 m)   Body mass index is 30.76 kg/m. Generalized: Well developed, in no acute distress  Head: normocephalic and atraumatic,. Oropharynx benign  Neck: Supple, no carotid bruits  Cardiac: Regular rate rhythm, no murmur  Musculoskeletal: No deformity   Neurological examination   MMSE - Mini Mental State Exam 05/25/2017 11/24/2016 05/19/2016  Orientation to time 5 4 4   Orientation to Place 5 5 3   Registration 3 3 3   Attention/ Calculation 5 5 5   Recall 2 1 1   Language- name 2 objects 2 2 2   Language- repeat 1 1 1    Language- follow 3 step command 3 3 3   Language- read & follow direction 1 1 1   Write a sentence 1 1 1   Copy design 1 1 1   Total score 29 27 25   animal naming 5  Cranial nerve II-XII: Pupils were equal round reactive to light extraocular movements were full, visual field were full on confrontational test. Facial sensation and strength were normal. hearing was intact to finger rubbing bilaterally. Uvula tongue midline. head turning and shoulder shrug were normal and symmetric.Tongue protrusion into cheek strength was normal. Motor: normal bulk and tone, full strength in the BUE, BLE, fine finger movements normal, no pronator drift. No focal weakness Sensory: normal and symmetric to light touch, pinprick, and Vibratioin the upper and lower extremities Coordination  finger-nose-finger, heel-to-shin  bilaterally, no dysmetria ReflexesSymmetric upper and lowerantar responses were flexor bilaterally. Gait and Station: Rising up from seated position with pushoff , wide based cautious gait ambulates with single-point cane   DIAGNOSTIC DATA (LABS, IMAGING, TESTING) - ASSESSMENT AND PLAN  83 years old female History of stroke  Vascular risk factor of hypertension, diabetes, aging,  Keep Plavix daily Gait abnormality  Multifactorial, deconditioning, aging process, joints pain, extensive periventricular white matter disease   Mild cognitive impairment  Mini-Mental Status Examination 29/30  Levert Feinstein, M.D. Ph.D.  [facility] [address] [place], [place] [address] Phone: [phone] Fax:      [phone]  CC: Georgianne Fick, MD

## 2017-05-31 DIAGNOSIS — I2510 Atherosclerotic heart disease of native coronary artery without angina pectoris: Secondary | ICD-10-CM | POA: Diagnosis not present

## 2017-05-31 DIAGNOSIS — E782 Mixed hyperlipidemia: Secondary | ICD-10-CM | POA: Diagnosis not present

## 2017-05-31 DIAGNOSIS — E1165 Type 2 diabetes mellitus with hyperglycemia: Secondary | ICD-10-CM | POA: Diagnosis not present

## 2017-05-31 DIAGNOSIS — M150 Primary generalized (osteo)arthritis: Secondary | ICD-10-CM | POA: Diagnosis not present

## 2017-06-01 DIAGNOSIS — B351 Tinea unguium: Secondary | ICD-10-CM | POA: Diagnosis not present

## 2017-06-01 DIAGNOSIS — E1351 Other specified diabetes mellitus with diabetic peripheral angiopathy without gangrene: Secondary | ICD-10-CM | POA: Diagnosis not present

## 2017-06-11 DIAGNOSIS — M150 Primary generalized (osteo)arthritis: Secondary | ICD-10-CM | POA: Diagnosis not present

## 2017-06-11 DIAGNOSIS — I2510 Atherosclerotic heart disease of native coronary artery without angina pectoris: Secondary | ICD-10-CM | POA: Diagnosis not present

## 2017-06-11 DIAGNOSIS — E1165 Type 2 diabetes mellitus with hyperglycemia: Secondary | ICD-10-CM | POA: Diagnosis not present

## 2017-06-11 DIAGNOSIS — Z9181 History of falling: Secondary | ICD-10-CM | POA: Diagnosis not present

## 2017-06-11 DIAGNOSIS — M8580 Other specified disorders of bone density and structure, unspecified site: Secondary | ICD-10-CM | POA: Diagnosis not present

## 2017-06-11 DIAGNOSIS — I10 Essential (primary) hypertension: Secondary | ICD-10-CM | POA: Diagnosis not present

## 2017-07-09 DIAGNOSIS — H2511 Age-related nuclear cataract, right eye: Secondary | ICD-10-CM | POA: Diagnosis not present

## 2017-07-09 DIAGNOSIS — E119 Type 2 diabetes mellitus without complications: Secondary | ICD-10-CM | POA: Diagnosis not present

## 2017-07-09 DIAGNOSIS — H35033 Hypertensive retinopathy, bilateral: Secondary | ICD-10-CM | POA: Diagnosis not present

## 2017-07-09 DIAGNOSIS — H25011 Cortical age-related cataract, right eye: Secondary | ICD-10-CM | POA: Diagnosis not present

## 2017-07-14 DIAGNOSIS — I2510 Atherosclerotic heart disease of native coronary artery without angina pectoris: Secondary | ICD-10-CM | POA: Diagnosis not present

## 2017-07-14 DIAGNOSIS — E1165 Type 2 diabetes mellitus with hyperglycemia: Secondary | ICD-10-CM | POA: Diagnosis not present

## 2017-07-14 DIAGNOSIS — M150 Primary generalized (osteo)arthritis: Secondary | ICD-10-CM | POA: Diagnosis not present

## 2017-07-14 DIAGNOSIS — R2681 Unsteadiness on feet: Secondary | ICD-10-CM | POA: Diagnosis not present

## 2017-08-17 DIAGNOSIS — B351 Tinea unguium: Secondary | ICD-10-CM | POA: Diagnosis not present

## 2017-08-17 DIAGNOSIS — E1351 Other specified diabetes mellitus with diabetic peripheral angiopathy without gangrene: Secondary | ICD-10-CM | POA: Diagnosis not present

## 2017-09-06 DIAGNOSIS — H26492 Other secondary cataract, left eye: Secondary | ICD-10-CM | POA: Diagnosis not present

## 2017-09-13 DIAGNOSIS — Z9181 History of falling: Secondary | ICD-10-CM | POA: Diagnosis not present

## 2017-09-13 DIAGNOSIS — Z7902 Long term (current) use of antithrombotics/antiplatelets: Secondary | ICD-10-CM | POA: Diagnosis not present

## 2017-09-13 DIAGNOSIS — M8580 Other specified disorders of bone density and structure, unspecified site: Secondary | ICD-10-CM | POA: Diagnosis not present

## 2017-09-13 DIAGNOSIS — F17200 Nicotine dependence, unspecified, uncomplicated: Secondary | ICD-10-CM | POA: Diagnosis not present

## 2017-09-27 DIAGNOSIS — E1165 Type 2 diabetes mellitus with hyperglycemia: Secondary | ICD-10-CM | POA: Diagnosis not present

## 2017-09-27 DIAGNOSIS — E782 Mixed hyperlipidemia: Secondary | ICD-10-CM | POA: Diagnosis not present

## 2017-09-27 DIAGNOSIS — I2510 Atherosclerotic heart disease of native coronary artery without angina pectoris: Secondary | ICD-10-CM | POA: Diagnosis not present

## 2017-10-04 DIAGNOSIS — I2510 Atherosclerotic heart disease of native coronary artery without angina pectoris: Secondary | ICD-10-CM | POA: Diagnosis not present

## 2017-10-04 DIAGNOSIS — E118 Type 2 diabetes mellitus with unspecified complications: Secondary | ICD-10-CM | POA: Diagnosis not present

## 2017-10-04 DIAGNOSIS — E782 Mixed hyperlipidemia: Secondary | ICD-10-CM | POA: Diagnosis not present

## 2017-10-04 DIAGNOSIS — E1165 Type 2 diabetes mellitus with hyperglycemia: Secondary | ICD-10-CM | POA: Diagnosis not present

## 2018-02-07 DIAGNOSIS — E118 Type 2 diabetes mellitus with unspecified complications: Secondary | ICD-10-CM | POA: Diagnosis not present

## 2018-02-07 DIAGNOSIS — I2510 Atherosclerotic heart disease of native coronary artery without angina pectoris: Secondary | ICD-10-CM | POA: Diagnosis not present

## 2018-02-07 DIAGNOSIS — Z0000 Encounter for general adult medical examination without abnormal findings: Secondary | ICD-10-CM | POA: Diagnosis not present

## 2018-02-07 DIAGNOSIS — E782 Mixed hyperlipidemia: Secondary | ICD-10-CM | POA: Diagnosis not present

## 2018-02-07 DIAGNOSIS — E1165 Type 2 diabetes mellitus with hyperglycemia: Secondary | ICD-10-CM | POA: Diagnosis not present

## 2018-02-14 DIAGNOSIS — I2510 Atherosclerotic heart disease of native coronary artery without angina pectoris: Secondary | ICD-10-CM | POA: Diagnosis not present

## 2018-02-14 DIAGNOSIS — E1165 Type 2 diabetes mellitus with hyperglycemia: Secondary | ICD-10-CM | POA: Diagnosis not present

## 2018-02-14 DIAGNOSIS — I10 Essential (primary) hypertension: Secondary | ICD-10-CM | POA: Diagnosis not present

## 2018-02-14 DIAGNOSIS — E782 Mixed hyperlipidemia: Secondary | ICD-10-CM | POA: Diagnosis not present

## 2018-03-16 DIAGNOSIS — H35033 Hypertensive retinopathy, bilateral: Secondary | ICD-10-CM | POA: Diagnosis not present

## 2018-03-16 DIAGNOSIS — H35363 Drusen (degenerative) of macula, bilateral: Secondary | ICD-10-CM | POA: Diagnosis not present

## 2018-03-16 DIAGNOSIS — E119 Type 2 diabetes mellitus without complications: Secondary | ICD-10-CM | POA: Diagnosis not present

## 2018-03-16 DIAGNOSIS — H2511 Age-related nuclear cataract, right eye: Secondary | ICD-10-CM | POA: Diagnosis not present

## 2018-08-15 DIAGNOSIS — E1165 Type 2 diabetes mellitus with hyperglycemia: Secondary | ICD-10-CM | POA: Diagnosis not present

## 2018-08-15 DIAGNOSIS — M150 Primary generalized (osteo)arthritis: Secondary | ICD-10-CM | POA: Diagnosis not present

## 2018-08-15 DIAGNOSIS — I2510 Atherosclerotic heart disease of native coronary artery without angina pectoris: Secondary | ICD-10-CM | POA: Diagnosis not present

## 2018-08-22 DIAGNOSIS — E118 Type 2 diabetes mellitus with unspecified complications: Secondary | ICD-10-CM | POA: Diagnosis not present

## 2018-08-22 DIAGNOSIS — E782 Mixed hyperlipidemia: Secondary | ICD-10-CM | POA: Diagnosis not present

## 2018-08-22 DIAGNOSIS — E1165 Type 2 diabetes mellitus with hyperglycemia: Secondary | ICD-10-CM | POA: Diagnosis not present

## 2018-08-22 DIAGNOSIS — I2510 Atherosclerotic heart disease of native coronary artery without angina pectoris: Secondary | ICD-10-CM | POA: Diagnosis not present

## 2018-10-26 DIAGNOSIS — E1351 Other specified diabetes mellitus with diabetic peripheral angiopathy without gangrene: Secondary | ICD-10-CM | POA: Diagnosis not present

## 2018-10-26 DIAGNOSIS — B351 Tinea unguium: Secondary | ICD-10-CM | POA: Diagnosis not present

## 2019-01-19 DIAGNOSIS — M79642 Pain in left hand: Secondary | ICD-10-CM | POA: Diagnosis not present

## 2019-02-23 DIAGNOSIS — I2510 Atherosclerotic heart disease of native coronary artery without angina pectoris: Secondary | ICD-10-CM | POA: Diagnosis not present

## 2019-02-23 DIAGNOSIS — E118 Type 2 diabetes mellitus with unspecified complications: Secondary | ICD-10-CM | POA: Diagnosis not present

## 2019-02-23 DIAGNOSIS — E1165 Type 2 diabetes mellitus with hyperglycemia: Secondary | ICD-10-CM | POA: Diagnosis not present

## 2019-02-23 DIAGNOSIS — E782 Mixed hyperlipidemia: Secondary | ICD-10-CM | POA: Diagnosis not present

## 2019-03-02 DIAGNOSIS — N390 Urinary tract infection, site not specified: Secondary | ICD-10-CM | POA: Diagnosis not present

## 2019-03-02 DIAGNOSIS — E118 Type 2 diabetes mellitus with unspecified complications: Secondary | ICD-10-CM | POA: Diagnosis not present

## 2019-03-02 DIAGNOSIS — I2510 Atherosclerotic heart disease of native coronary artery without angina pectoris: Secondary | ICD-10-CM | POA: Diagnosis not present

## 2019-03-02 DIAGNOSIS — I10 Essential (primary) hypertension: Secondary | ICD-10-CM | POA: Diagnosis not present

## 2019-03-02 DIAGNOSIS — E1165 Type 2 diabetes mellitus with hyperglycemia: Secondary | ICD-10-CM | POA: Diagnosis not present

## 2019-03-02 DIAGNOSIS — Z0000 Encounter for general adult medical examination without abnormal findings: Secondary | ICD-10-CM | POA: Diagnosis not present

## 2019-04-28 DIAGNOSIS — D500 Iron deficiency anemia secondary to blood loss (chronic): Secondary | ICD-10-CM | POA: Diagnosis not present

## 2019-05-05 DIAGNOSIS — I2510 Atherosclerotic heart disease of native coronary artery without angina pectoris: Secondary | ICD-10-CM | POA: Diagnosis not present

## 2019-05-05 DIAGNOSIS — E1165 Type 2 diabetes mellitus with hyperglycemia: Secondary | ICD-10-CM | POA: Diagnosis not present

## 2019-05-05 DIAGNOSIS — D500 Iron deficiency anemia secondary to blood loss (chronic): Secondary | ICD-10-CM | POA: Diagnosis not present

## 2019-05-05 DIAGNOSIS — R634 Abnormal weight loss: Secondary | ICD-10-CM | POA: Diagnosis not present

## 2019-06-15 DIAGNOSIS — B351 Tinea unguium: Secondary | ICD-10-CM | POA: Diagnosis not present

## 2019-06-15 DIAGNOSIS — M79671 Pain in right foot: Secondary | ICD-10-CM | POA: Diagnosis not present

## 2019-06-15 DIAGNOSIS — E1351 Other specified diabetes mellitus with diabetic peripheral angiopathy without gangrene: Secondary | ICD-10-CM | POA: Diagnosis not present

## 2019-06-15 DIAGNOSIS — M79672 Pain in left foot: Secondary | ICD-10-CM | POA: Diagnosis not present

## 2019-06-23 DIAGNOSIS — D500 Iron deficiency anemia secondary to blood loss (chronic): Secondary | ICD-10-CM | POA: Diagnosis not present

## 2019-06-23 DIAGNOSIS — E1165 Type 2 diabetes mellitus with hyperglycemia: Secondary | ICD-10-CM | POA: Diagnosis not present

## 2019-06-29 DIAGNOSIS — E782 Mixed hyperlipidemia: Secondary | ICD-10-CM | POA: Diagnosis not present

## 2019-06-29 DIAGNOSIS — D500 Iron deficiency anemia secondary to blood loss (chronic): Secondary | ICD-10-CM | POA: Diagnosis not present

## 2019-06-29 DIAGNOSIS — E1165 Type 2 diabetes mellitus with hyperglycemia: Secondary | ICD-10-CM | POA: Diagnosis not present

## 2019-06-29 DIAGNOSIS — I10 Essential (primary) hypertension: Secondary | ICD-10-CM | POA: Diagnosis not present

## 2019-07-04 DIAGNOSIS — H2511 Age-related nuclear cataract, right eye: Secondary | ICD-10-CM | POA: Diagnosis not present

## 2019-07-04 DIAGNOSIS — H25011 Cortical age-related cataract, right eye: Secondary | ICD-10-CM | POA: Diagnosis not present

## 2019-07-04 DIAGNOSIS — Z961 Presence of intraocular lens: Secondary | ICD-10-CM | POA: Diagnosis not present

## 2019-07-04 DIAGNOSIS — E119 Type 2 diabetes mellitus without complications: Secondary | ICD-10-CM | POA: Diagnosis not present

## 2019-10-12 DIAGNOSIS — E782 Mixed hyperlipidemia: Secondary | ICD-10-CM | POA: Diagnosis not present

## 2019-10-12 DIAGNOSIS — D500 Iron deficiency anemia secondary to blood loss (chronic): Secondary | ICD-10-CM | POA: Diagnosis not present

## 2019-10-12 DIAGNOSIS — E1165 Type 2 diabetes mellitus with hyperglycemia: Secondary | ICD-10-CM | POA: Diagnosis not present

## 2019-10-12 DIAGNOSIS — I10 Essential (primary) hypertension: Secondary | ICD-10-CM | POA: Diagnosis not present

## 2019-10-17 DIAGNOSIS — E1165 Type 2 diabetes mellitus with hyperglycemia: Secondary | ICD-10-CM | POA: Diagnosis not present

## 2019-10-17 DIAGNOSIS — D500 Iron deficiency anemia secondary to blood loss (chronic): Secondary | ICD-10-CM | POA: Diagnosis not present

## 2019-10-17 DIAGNOSIS — E782 Mixed hyperlipidemia: Secondary | ICD-10-CM | POA: Diagnosis not present

## 2019-10-17 DIAGNOSIS — I10 Essential (primary) hypertension: Secondary | ICD-10-CM | POA: Diagnosis not present

## 2020-05-13 DIAGNOSIS — E1165 Type 2 diabetes mellitus with hyperglycemia: Secondary | ICD-10-CM | POA: Diagnosis not present

## 2020-05-13 DIAGNOSIS — D500 Iron deficiency anemia secondary to blood loss (chronic): Secondary | ICD-10-CM | POA: Diagnosis not present

## 2020-05-13 DIAGNOSIS — E782 Mixed hyperlipidemia: Secondary | ICD-10-CM | POA: Diagnosis not present

## 2020-05-13 DIAGNOSIS — I10 Essential (primary) hypertension: Secondary | ICD-10-CM | POA: Diagnosis not present

## 2020-05-20 DIAGNOSIS — E118 Type 2 diabetes mellitus with unspecified complications: Secondary | ICD-10-CM | POA: Diagnosis not present

## 2020-05-20 DIAGNOSIS — E1165 Type 2 diabetes mellitus with hyperglycemia: Secondary | ICD-10-CM | POA: Diagnosis not present

## 2020-05-20 DIAGNOSIS — I10 Essential (primary) hypertension: Secondary | ICD-10-CM | POA: Diagnosis not present

## 2020-05-20 DIAGNOSIS — Z0000 Encounter for general adult medical examination without abnormal findings: Secondary | ICD-10-CM | POA: Diagnosis not present

## 2020-10-14 DIAGNOSIS — E1165 Type 2 diabetes mellitus with hyperglycemia: Secondary | ICD-10-CM | POA: Diagnosis not present

## 2020-10-14 DIAGNOSIS — I10 Essential (primary) hypertension: Secondary | ICD-10-CM | POA: Diagnosis not present

## 2020-10-14 DIAGNOSIS — E782 Mixed hyperlipidemia: Secondary | ICD-10-CM | POA: Diagnosis not present

## 2020-10-14 DIAGNOSIS — E118 Type 2 diabetes mellitus with unspecified complications: Secondary | ICD-10-CM | POA: Diagnosis not present

## 2020-10-21 DIAGNOSIS — E782 Mixed hyperlipidemia: Secondary | ICD-10-CM | POA: Diagnosis not present

## 2020-10-21 DIAGNOSIS — I10 Essential (primary) hypertension: Secondary | ICD-10-CM | POA: Diagnosis not present

## 2020-10-21 DIAGNOSIS — E1165 Type 2 diabetes mellitus with hyperglycemia: Secondary | ICD-10-CM | POA: Diagnosis not present

## 2020-10-21 DIAGNOSIS — E118 Type 2 diabetes mellitus with unspecified complications: Secondary | ICD-10-CM | POA: Diagnosis not present

## 2020-12-20 DIAGNOSIS — H25011 Cortical age-related cataract, right eye: Secondary | ICD-10-CM | POA: Diagnosis not present

## 2020-12-20 DIAGNOSIS — E119 Type 2 diabetes mellitus without complications: Secondary | ICD-10-CM | POA: Diagnosis not present

## 2020-12-20 DIAGNOSIS — Z961 Presence of intraocular lens: Secondary | ICD-10-CM | POA: Diagnosis not present

## 2020-12-20 DIAGNOSIS — H2511 Age-related nuclear cataract, right eye: Secondary | ICD-10-CM | POA: Diagnosis not present

## 2021-01-30 DIAGNOSIS — I10 Essential (primary) hypertension: Secondary | ICD-10-CM | POA: Diagnosis not present

## 2021-01-30 DIAGNOSIS — E782 Mixed hyperlipidemia: Secondary | ICD-10-CM | POA: Diagnosis not present

## 2021-01-30 DIAGNOSIS — E1165 Type 2 diabetes mellitus with hyperglycemia: Secondary | ICD-10-CM | POA: Diagnosis not present

## 2021-01-30 DIAGNOSIS — E118 Type 2 diabetes mellitus with unspecified complications: Secondary | ICD-10-CM | POA: Diagnosis not present

## 2021-02-13 DIAGNOSIS — E1122 Type 2 diabetes mellitus with diabetic chronic kidney disease: Secondary | ICD-10-CM | POA: Diagnosis not present

## 2021-02-13 DIAGNOSIS — I10 Essential (primary) hypertension: Secondary | ICD-10-CM | POA: Diagnosis not present

## 2021-02-13 DIAGNOSIS — E782 Mixed hyperlipidemia: Secondary | ICD-10-CM | POA: Diagnosis not present

## 2021-02-13 DIAGNOSIS — N1831 Chronic kidney disease, stage 3a: Secondary | ICD-10-CM | POA: Diagnosis not present

## 2021-03-27 DIAGNOSIS — N1831 Chronic kidney disease, stage 3a: Secondary | ICD-10-CM | POA: Diagnosis not present

## 2021-03-27 DIAGNOSIS — E118 Type 2 diabetes mellitus with unspecified complications: Secondary | ICD-10-CM | POA: Diagnosis not present

## 2021-03-27 DIAGNOSIS — E1122 Type 2 diabetes mellitus with diabetic chronic kidney disease: Secondary | ICD-10-CM | POA: Diagnosis not present

## 2021-04-17 DIAGNOSIS — E1351 Other specified diabetes mellitus with diabetic peripheral angiopathy without gangrene: Secondary | ICD-10-CM | POA: Diagnosis not present

## 2021-04-17 DIAGNOSIS — M2042 Other hammer toe(s) (acquired), left foot: Secondary | ICD-10-CM | POA: Diagnosis not present

## 2021-04-17 DIAGNOSIS — B351 Tinea unguium: Secondary | ICD-10-CM | POA: Diagnosis not present

## 2021-04-17 DIAGNOSIS — M6281 Muscle weakness (generalized): Secondary | ICD-10-CM | POA: Diagnosis not present

## 2021-05-20 DIAGNOSIS — E119 Type 2 diabetes mellitus without complications: Secondary | ICD-10-CM | POA: Diagnosis not present

## 2021-05-20 DIAGNOSIS — H2511 Age-related nuclear cataract, right eye: Secondary | ICD-10-CM | POA: Diagnosis not present

## 2021-05-20 DIAGNOSIS — H25011 Cortical age-related cataract, right eye: Secondary | ICD-10-CM | POA: Diagnosis not present

## 2021-05-20 DIAGNOSIS — Z961 Presence of intraocular lens: Secondary | ICD-10-CM | POA: Diagnosis not present

## 2021-05-29 DIAGNOSIS — I10 Essential (primary) hypertension: Secondary | ICD-10-CM | POA: Diagnosis not present

## 2021-05-29 DIAGNOSIS — D500 Iron deficiency anemia secondary to blood loss (chronic): Secondary | ICD-10-CM | POA: Diagnosis not present

## 2021-05-29 DIAGNOSIS — E1165 Type 2 diabetes mellitus with hyperglycemia: Secondary | ICD-10-CM | POA: Diagnosis not present

## 2021-05-29 DIAGNOSIS — E782 Mixed hyperlipidemia: Secondary | ICD-10-CM | POA: Diagnosis not present

## 2021-05-29 DIAGNOSIS — I2510 Atherosclerotic heart disease of native coronary artery without angina pectoris: Secondary | ICD-10-CM | POA: Diagnosis not present

## 2021-05-30 DIAGNOSIS — D500 Iron deficiency anemia secondary to blood loss (chronic): Secondary | ICD-10-CM | POA: Diagnosis not present

## 2021-05-30 DIAGNOSIS — I10 Essential (primary) hypertension: Secondary | ICD-10-CM | POA: Diagnosis not present

## 2021-05-30 DIAGNOSIS — I2510 Atherosclerotic heart disease of native coronary artery without angina pectoris: Secondary | ICD-10-CM | POA: Diagnosis not present

## 2021-05-30 DIAGNOSIS — E1165 Type 2 diabetes mellitus with hyperglycemia: Secondary | ICD-10-CM | POA: Diagnosis not present

## 2021-05-30 DIAGNOSIS — E782 Mixed hyperlipidemia: Secondary | ICD-10-CM | POA: Diagnosis not present

## 2021-06-04 DIAGNOSIS — H25011 Cortical age-related cataract, right eye: Secondary | ICD-10-CM | POA: Diagnosis not present

## 2021-06-04 DIAGNOSIS — H25811 Combined forms of age-related cataract, right eye: Secondary | ICD-10-CM | POA: Diagnosis not present

## 2021-06-04 DIAGNOSIS — H5703 Miosis: Secondary | ICD-10-CM | POA: Diagnosis not present

## 2021-06-04 DIAGNOSIS — H2511 Age-related nuclear cataract, right eye: Secondary | ICD-10-CM | POA: Diagnosis not present

## 2021-06-05 DIAGNOSIS — N1831 Chronic kidney disease, stage 3a: Secondary | ICD-10-CM | POA: Diagnosis not present

## 2021-06-05 DIAGNOSIS — E1122 Type 2 diabetes mellitus with diabetic chronic kidney disease: Secondary | ICD-10-CM | POA: Diagnosis not present

## 2021-06-05 DIAGNOSIS — Z0000 Encounter for general adult medical examination without abnormal findings: Secondary | ICD-10-CM | POA: Diagnosis not present

## 2021-06-05 DIAGNOSIS — I10 Essential (primary) hypertension: Secondary | ICD-10-CM | POA: Diagnosis not present

## 2021-07-01 ENCOUNTER — Institutional Professional Consult (permissible substitution): Payer: Medicare Other | Admitting: Neurology

## 2021-07-22 ENCOUNTER — Ambulatory Visit: Payer: Medicare Other | Admitting: Neurology

## 2021-07-31 DIAGNOSIS — B351 Tinea unguium: Secondary | ICD-10-CM | POA: Diagnosis not present

## 2021-07-31 DIAGNOSIS — E1351 Other specified diabetes mellitus with diabetic peripheral angiopathy without gangrene: Secondary | ICD-10-CM | POA: Diagnosis not present

## 2021-08-28 DIAGNOSIS — N1831 Chronic kidney disease, stage 3a: Secondary | ICD-10-CM | POA: Diagnosis not present

## 2021-08-28 DIAGNOSIS — E782 Mixed hyperlipidemia: Secondary | ICD-10-CM | POA: Diagnosis not present

## 2021-08-28 DIAGNOSIS — E1122 Type 2 diabetes mellitus with diabetic chronic kidney disease: Secondary | ICD-10-CM | POA: Diagnosis not present

## 2021-08-28 DIAGNOSIS — D500 Iron deficiency anemia secondary to blood loss (chronic): Secondary | ICD-10-CM | POA: Diagnosis not present

## 2021-09-04 DIAGNOSIS — E782 Mixed hyperlipidemia: Secondary | ICD-10-CM | POA: Diagnosis not present

## 2021-09-04 DIAGNOSIS — N1831 Chronic kidney disease, stage 3a: Secondary | ICD-10-CM | POA: Diagnosis not present

## 2021-09-04 DIAGNOSIS — R634 Abnormal weight loss: Secondary | ICD-10-CM | POA: Diagnosis not present

## 2021-09-04 DIAGNOSIS — E1122 Type 2 diabetes mellitus with diabetic chronic kidney disease: Secondary | ICD-10-CM | POA: Diagnosis not present

## 2021-09-05 ENCOUNTER — Other Ambulatory Visit: Payer: Self-pay | Admitting: Internal Medicine

## 2021-09-05 DIAGNOSIS — R634 Abnormal weight loss: Secondary | ICD-10-CM

## 2021-09-23 ENCOUNTER — Other Ambulatory Visit: Payer: Self-pay

## 2021-09-26 ENCOUNTER — Ambulatory Visit
Admission: RE | Admit: 2021-09-26 | Discharge: 2021-09-26 | Disposition: A | Discharge status: Home / Self Care | Payer: Medicare Other | Source: Ambulatory Visit | Attending: Internal Medicine | Admitting: Internal Medicine

## 2021-09-26 ENCOUNTER — Other Ambulatory Visit: Payer: Self-pay

## 2021-09-26 DIAGNOSIS — R918 Other nonspecific abnormal finding of lung field: Secondary | ICD-10-CM | POA: Diagnosis not present

## 2021-09-26 DIAGNOSIS — J439 Emphysema, unspecified: Secondary | ICD-10-CM | POA: Diagnosis not present

## 2021-09-26 DIAGNOSIS — K5730 Diverticulosis of large intestine without perforation or abscess without bleeding: Secondary | ICD-10-CM | POA: Diagnosis not present

## 2021-09-26 DIAGNOSIS — R634 Abnormal weight loss: Secondary | ICD-10-CM

## 2021-09-26 DIAGNOSIS — R911 Solitary pulmonary nodule: Secondary | ICD-10-CM | POA: Diagnosis not present

## 2021-09-26 DIAGNOSIS — M47817 Spondylosis without myelopathy or radiculopathy, lumbosacral region: Secondary | ICD-10-CM | POA: Diagnosis not present

## 2021-09-26 DIAGNOSIS — K449 Diaphragmatic hernia without obstruction or gangrene: Secondary | ICD-10-CM | POA: Diagnosis not present

## 2021-09-26 MED ORDER — IOPAMIDOL (ISOVUE-300) INJECTION 61%
80.0000 mL | Freq: Once | INTRAVENOUS | Status: AC | PRN
  Administered 2021-09-26: 80 mL via INTRAVENOUS

## 2021-10-02 ENCOUNTER — Encounter: Payer: Self-pay | Admitting: Neurology

## 2021-10-02 ENCOUNTER — Ambulatory Visit: Payer: Medicare Other | Admitting: Neurology

## 2021-10-02 ENCOUNTER — Telehealth: Payer: Self-pay | Admitting: Neurology

## 2021-10-02 VITALS — BP 165/76 | HR 60 | Ht 66.0 in | Wt 169.0 lb

## 2021-10-02 DIAGNOSIS — R269 Unspecified abnormalities of gait and mobility: Secondary | ICD-10-CM | POA: Diagnosis not present

## 2021-10-02 DIAGNOSIS — E538 Deficiency of other specified B group vitamins: Secondary | ICD-10-CM | POA: Diagnosis not present

## 2021-10-02 DIAGNOSIS — F03911 Unspecified dementia, unspecified severity, with agitation: Secondary | ICD-10-CM

## 2021-10-02 DIAGNOSIS — G309 Alzheimer's disease, unspecified: Secondary | ICD-10-CM | POA: Diagnosis not present

## 2021-10-02 DIAGNOSIS — R799 Abnormal finding of blood chemistry, unspecified: Secondary | ICD-10-CM | POA: Diagnosis not present

## 2021-10-02 HISTORY — DX: Unspecified dementia, unspecified severity, with agitation: F03.911

## 2021-10-02 HISTORY — DX: Unspecified abnormalities of gait and mobility: R26.9

## 2021-10-02 NOTE — Telephone Encounter (Signed)
[employer] medicare order sent to [facility], [facility] they will reach out to the patient to schedule.  

## 2021-10-02 NOTE — Telephone Encounter (Signed)
Patient's home health referral was accepted by [facility]. They will call patient to start care.

## 2021-10-02 NOTE — Progress Notes (Signed)
Chief Complaint  Patient presents with   New Patient (Initial Visit)    RM 5, With Niece Debra Skinner , c/o memory worse and behavorial changes , some balance issues, using cane, reports no falls this year       ASSESSMENT AND PLAN  Debra Skinner is a 87 y.o. female   ASSESSMENT AND PLAN   87 years old female History of stroke             Vascular risk factor of hypertension, diabetes, aging,             Keep Plavix daily  Worsening dementia,  MoCA 16/30,  Increased difficulty staying alone, handling her daily activity, she does not have children, widowed, no power of attorney,  Referral to neuropsychiatric evaluation,  Her niece Debra Skinner and her great niece Debra Skinner have increased concern of her staying alone at home, wants to explore the possibility of long-term care choice.  Repeat MRI of the brain  Laboratory evaluation to rule out treatable etiology  Gait abnormality             Multifactorial, deconditioning, aging process, joints pain, extensive periventricular white matter disease  Home physical therapy, social worker for long-term placement issues              DIAGNOSTIC DATA (LABS, IMAGING, TESTING) - I reviewed patient records, labs, notes, testing and imaging myself where available.   MEDICAL HISTORY:  Debra Skinner is a 87 year old female, seen in request by primary care physician Dr. Nicholos Skinner, Debra Skinner for evaluation of dementia, worsening gait abnormality, she is accompanied by her niece Debra Skinner at today's visit October 02, 2021, her great niece Debra Skinner was connected via the phone call.  I reviewed and summarized the referring note. PMHx. HTN DM Stroke  I saw her previously in 2018 for dementia and gait abnormality as well,  Personally reviewed MRI of the brain in 2013, no acute abnormality, generalized atrophy, moderate small vessel disease  She had a college degree,,majored in business, worked at [employer] for many years, retired in 1994, she  widowed in 1985, lives alone, has no children, quit driving few years ago, her niece also started paying her bill about 2018  Previous laboratory evaluation showed no treatable etiology,  She still lives alone at home, but had worsening memory loss, agitations, resistant to be helped, spent most of the time alone at home, Debra Skinner and [place] check on her couple times each week, she spent most of the time sitting down watching TV, no longer able to cook, had left her stove unattended many times, ambulate with a cane, often forgetting her pills, get agitated easily, she denies hallucinations,   She has no power of attorney, Debra Skinner and Debra Skinner has increased concern of her staying alone, MoCA examination today is 16/30, spent lengthy time discussed with her to proceed next for her to be at a more safe environment.  PHYSICAL EXAM:   Vitals:   10/02/21 1000  BP: (!) 177/74  Pulse: 70  Weight: 169 lb (76.7 kg)  Height: 5\' 6"  (1.676 m)   Not recorded     Body mass index is 27.28 kg/m.  PHYSICAL EXAMNIATION:  Gen: NAD, conversant, well nourised, well groomed                     Cardiovascular: Regular rate rhythm, no peripheral edema, warm, nontender. Eyes: Conjunctivae clear without exudates or hemorrhage Neck: Supple, no carotid bruits. Pulmonary: Clear to  auscultation bilaterally   NEUROLOGICAL EXAM:  MENTAL STATUS: Speech:    Speech is normal; fluent and spontaneous with normal comprehension.  Cognition:     Montreal Cognitive Assessment  10/02/2021  Visuospatial/ Executive (0/5) 0  Naming (0/3) 2  Attention: Read list of digits (0/2) 2  Attention: Read list of letters (0/1) 1  Attention: Serial 7 subtraction starting at 100 (0/3) 1  Language: Repeat phrase (0/2) 1  Language : Fluency (0/1) 1  Abstraction (0/2) 2  Delayed Recall (0/5) 0  Orientation (0/6) 6  Total 16      CRANIAL NERVES: CN II: Visual fields are full to confrontation. Pupils are round equal and briskly  reactive to light. CN III, IV, VI: extraocular movement are normal. No ptosis. CN V: Facial sensation is intact to light touch CN VII: Face is symmetric with normal eye closure  CN VIII: Hearing is normal to causal conversation. CN IX, X: Phonation is normal. CN XI: Head turning and shoulder shrug are intact  MOTOR: There is no pronator drift of out-stretched arms. Muscle bulk and tone are normal. Muscle strength is normal.  REFLEXES: Reflexes are 1 and symmetric at the biceps, triceps, knees, and ankles. Plantar responses are flexor.  SENSORY: Intact to light touch, pinprick   COORDINATION: There is no trunk or limb dysmetria noted.  GAIT/STANCE: Need push-up to get up from seated position, cautious, unsteady  REVIEW OF SYSTEMS:  Full 14 system review of systems performed and notable only for as above All other review of systems were negative.   ALLERGIES: Allergies  Allergen Reactions   Penicillins     Facial edema    HOME MEDICATIONS: Current Outpatient Medications  Medication Sig Dispense Refill   amLODipine (NORVASC) 5 MG tablet Take 5 mg by mouth daily.     clopidogrel (PLAVIX) 75 MG tablet Take 1 tablet (75 mg total) by mouth daily with breakfast. 90 tablet 3   Fe Fum-FePoly-Vit C-Vit B3 (INTEGRA) 62.5-62.5-40-3 MG CAPS Take 1 capsule by mouth daily.  3   glimepiride (AMARYL) 4 MG tablet Take 4 mg by mouth daily.  11   lisinopril-hydrochlorothiazide (PRINZIDE,ZESTORETIC) 20-12.5 MG per tablet Take 1 tablet by mouth daily.     No current facility-administered medications for this visit.    PAST MEDICAL HISTORY: Past Medical History:  Diagnosis Date   CVA (cerebral infarction)    Diabetes (HCC)    Gait disturbance    Hypertension    Memory loss    Stroke [facility]) 2013   "mini stroke"   Weakness     PAST SURGICAL HISTORY: Past Surgical History:  Procedure Laterality Date   CATARACT EXTRACTION Left    10-07-16   toe nail      FAMILY HISTORY: Family  History  Problem Relation Age of Onset   Cervical cancer Mother    High blood pressure Father    Aneurysm Father     SOCIAL HISTORY: Social History   Socioeconomic History   Marital status: Widowed    Spouse name: Not on file   Number of children: 0   Years of education: college   Highest education level: Not on file  Occupational History    Employer: RETIRED    Comment: Retired  Tobacco Use   Smoking status: Some Days    Packs/day: 0.50    Types: Cigarettes   Smokeless tobacco: Never  Substance and Sexual Activity   Alcohol use: No    Alcohol/week: 0.0 standard drinks   Drug  use: No   Sexual activity: Not on file  Other Topics Concern   Not on file  Social History Narrative   Patient is retired and lives at   Home alone.    Education- College    Right handed.   Caffeine-  Coffee one daily, soda-  And tea daily.            Social Determinants of Health   Financial Resource Strain: Not on file  Food Insecurity: Not on file  Transportation Needs: Not on file  Physical Activity: Not on file  Stress: Not on file  Social Connections: Not on file  Intimate Partner Violence: Not on file     Total time spent reviewing the chart, obtaining history, examined patient, ordering tests, documentation, consultations and family, care coordination was 65 minutes   Levert Feinstein, M.D. Ph.D.  [facility] [address] [place], [place] [address] Ph: [phone] Fax: [phone]  CC:  Georgianne Fick, MD [address] [place],  [place] [address]  Georgianne Fick, MD

## 2021-10-03 DIAGNOSIS — Z7901 Long term (current) use of anticoagulants: Secondary | ICD-10-CM | POA: Diagnosis not present

## 2021-10-03 DIAGNOSIS — E119 Type 2 diabetes mellitus without complications: Secondary | ICD-10-CM | POA: Diagnosis not present

## 2021-10-03 DIAGNOSIS — R269 Unspecified abnormalities of gait and mobility: Secondary | ICD-10-CM | POA: Diagnosis not present

## 2021-10-03 DIAGNOSIS — R531 Weakness: Secondary | ICD-10-CM | POA: Diagnosis not present

## 2021-10-03 DIAGNOSIS — F02811 Dementia in other diseases classified elsewhere, unspecified severity, with agitation: Secondary | ICD-10-CM | POA: Diagnosis not present

## 2021-10-03 DIAGNOSIS — G309 Alzheimer's disease, unspecified: Secondary | ICD-10-CM | POA: Diagnosis not present

## 2021-10-03 DIAGNOSIS — F02818 Dementia in other diseases classified elsewhere, unspecified severity, with other behavioral disturbance: Secondary | ICD-10-CM | POA: Diagnosis not present

## 2021-10-03 DIAGNOSIS — M9971 Connective tissue and disc stenosis of intervertebral foramina of cervical region: Secondary | ICD-10-CM | POA: Diagnosis not present

## 2021-10-03 DIAGNOSIS — I10 Essential (primary) hypertension: Secondary | ICD-10-CM | POA: Diagnosis not present

## 2021-10-03 DIAGNOSIS — Z8673 Personal history of transient ischemic attack (TIA), and cerebral infarction without residual deficits: Secondary | ICD-10-CM | POA: Diagnosis not present

## 2021-10-03 DIAGNOSIS — F17210 Nicotine dependence, cigarettes, uncomplicated: Secondary | ICD-10-CM | POA: Diagnosis not present

## 2021-10-03 LAB — RPR: RPR Ser Ql: NONREACTIVE

## 2021-10-03 LAB — VITAMIN B12: Vitamin B-12: 249 pg/mL (ref 232–1245)

## 2021-10-06 ENCOUNTER — Telehealth: Payer: Self-pay | Admitting: Neurology

## 2021-10-06 NOTE — Telephone Encounter (Signed)
Please call patient for low normal range B12, she will benefit over-the-counter B12 supplement 1000 mcg daily, rest of the laboratory evaluation showed no significant abnormalities.

## 2021-10-06 NOTE — Telephone Encounter (Signed)
Called the patient's niece to review the lab results. There was no answer. LVM asking for a call back.   **If she calls back please advise that Dr Terrace Arabia looked over lab results and overall labs look good. The only concerning lab was vitamin b 12 is low and she would recommend that Ms. Linch starts taking a over the counter vitamin b 12 supplement. This is in the vitamin section and she recommends she take daily.

## 2021-10-07 ENCOUNTER — Encounter: Payer: Self-pay | Admitting: Psychology

## 2021-10-08 ENCOUNTER — Telehealth: Payer: Self-pay | Admitting: Neurology

## 2021-10-08 NOTE — Telephone Encounter (Signed)
I returned the call and left message to proceed with orders for patient, as outlined below.

## 2021-10-08 NOTE — Telephone Encounter (Signed)
Debra Skinner, PT @ [facility] has called for verbal orders for 1 week 9.  Her vm is secure for vm's

## 2021-10-17 ENCOUNTER — Ambulatory Visit
Admission: RE | Admit: 2021-10-17 | Discharge: 2021-10-17 | Disposition: A | Discharge status: Home / Self Care | Payer: Medicare Other | Source: Ambulatory Visit | Attending: Neurology | Admitting: Neurology

## 2021-10-17 ENCOUNTER — Other Ambulatory Visit: Payer: Self-pay

## 2021-10-17 DIAGNOSIS — G309 Alzheimer's disease, unspecified: Secondary | ICD-10-CM | POA: Diagnosis not present

## 2021-10-20 ENCOUNTER — Telehealth: Payer: Self-pay | Admitting: Neurology

## 2021-10-20 NOTE — Telephone Encounter (Signed)
I spoke to her niece, Casey Burkitt, on [place]. Provided the MRI brain results below and she verbalized understanding.

## 2021-10-20 NOTE — Telephone Encounter (Signed)
  IMPRESSION: This MRI of the brain without contrast shows the following: 1.   Generalized cortical atrophy that is a little more pronounced in the medial temporal lobes and the insular cortex.  This is mildly progressed compared to the 04/18/2012 MRI. 2.   Extensive T2/FLAIR hyperintense foci in the hemispheres and also some changes in the pons consistent with moderate chronic microvascular ischemic change. 3.   Chronic lacunar infarction in the corona radiata on the left that was not present on the 2013 MRI 4.   No acute findings   Please call patient, MRI of the brain showed generalized atrophy, extensive periventricular small vessel disease, evidence of enlarged ventricle,  there was no acute abnormality

## 2021-11-02 DIAGNOSIS — Z7901 Long term (current) use of anticoagulants: Secondary | ICD-10-CM | POA: Diagnosis not present

## 2021-11-02 DIAGNOSIS — I10 Essential (primary) hypertension: Secondary | ICD-10-CM | POA: Diagnosis not present

## 2021-11-02 DIAGNOSIS — R269 Unspecified abnormalities of gait and mobility: Secondary | ICD-10-CM | POA: Diagnosis not present

## 2021-11-02 DIAGNOSIS — R531 Weakness: Secondary | ICD-10-CM | POA: Diagnosis not present

## 2021-11-02 DIAGNOSIS — G309 Alzheimer's disease, unspecified: Secondary | ICD-10-CM | POA: Diagnosis not present

## 2021-11-02 DIAGNOSIS — F17210 Nicotine dependence, cigarettes, uncomplicated: Secondary | ICD-10-CM | POA: Diagnosis not present

## 2021-11-02 DIAGNOSIS — F02811 Dementia in other diseases classified elsewhere, unspecified severity, with agitation: Secondary | ICD-10-CM | POA: Diagnosis not present

## 2021-11-02 DIAGNOSIS — F02818 Dementia in other diseases classified elsewhere, unspecified severity, with other behavioral disturbance: Secondary | ICD-10-CM | POA: Diagnosis not present

## 2021-11-02 DIAGNOSIS — M9971 Connective tissue and disc stenosis of intervertebral foramina of cervical region: Secondary | ICD-10-CM | POA: Diagnosis not present

## 2021-11-02 DIAGNOSIS — E119 Type 2 diabetes mellitus without complications: Secondary | ICD-10-CM | POA: Diagnosis not present

## 2021-11-02 DIAGNOSIS — Z8673 Personal history of transient ischemic attack (TIA), and cerebral infarction without residual deficits: Secondary | ICD-10-CM | POA: Diagnosis not present

## 2021-12-10 DIAGNOSIS — E1165 Type 2 diabetes mellitus with hyperglycemia: Secondary | ICD-10-CM | POA: Diagnosis not present

## 2021-12-10 DIAGNOSIS — I10 Essential (primary) hypertension: Secondary | ICD-10-CM | POA: Diagnosis not present

## 2021-12-16 DIAGNOSIS — E1151 Type 2 diabetes mellitus with diabetic peripheral angiopathy without gangrene: Secondary | ICD-10-CM | POA: Diagnosis not present

## 2021-12-16 DIAGNOSIS — B351 Tinea unguium: Secondary | ICD-10-CM | POA: Diagnosis not present

## 2021-12-17 DIAGNOSIS — I2510 Atherosclerotic heart disease of native coronary artery without angina pectoris: Secondary | ICD-10-CM | POA: Diagnosis not present

## 2021-12-17 DIAGNOSIS — E782 Mixed hyperlipidemia: Secondary | ICD-10-CM | POA: Diagnosis not present

## 2021-12-17 DIAGNOSIS — I10 Essential (primary) hypertension: Secondary | ICD-10-CM | POA: Diagnosis not present

## 2021-12-17 DIAGNOSIS — E118 Type 2 diabetes mellitus with unspecified complications: Secondary | ICD-10-CM | POA: Diagnosis not present

## 2022-02-03 ENCOUNTER — Telehealth: Payer: Self-pay | Admitting: Neurology

## 2022-02-03 NOTE — Telephone Encounter (Signed)
noted 

## 2022-02-03 NOTE — Telephone Encounter (Signed)
[facility]) Home Health Certification and Plan of Care was faxed over with physician's signature, but without the date.  Going to fax back over to enter the date and you can fax back to [place] at [phone]. So we can file the claim.

## 2022-02-04 NOTE — Telephone Encounter (Signed)
Paperwork received, dated and faxed back with confirmation. ?

## 2022-02-09 DIAGNOSIS — Z961 Presence of intraocular lens: Secondary | ICD-10-CM | POA: Diagnosis not present

## 2022-02-09 DIAGNOSIS — E119 Type 2 diabetes mellitus without complications: Secondary | ICD-10-CM | POA: Diagnosis not present

## 2022-02-09 DIAGNOSIS — H35033 Hypertensive retinopathy, bilateral: Secondary | ICD-10-CM | POA: Diagnosis not present

## 2022-02-09 DIAGNOSIS — H04123 Dry eye syndrome of bilateral lacrimal glands: Secondary | ICD-10-CM | POA: Diagnosis not present

## 2022-02-24 ENCOUNTER — Other Ambulatory Visit: Payer: Self-pay | Admitting: Internal Medicine

## 2022-02-24 DIAGNOSIS — R918 Other nonspecific abnormal finding of lung field: Secondary | ICD-10-CM

## 2022-03-24 ENCOUNTER — Ambulatory Visit
Admission: RE | Admit: 2022-03-24 | Discharge: 2022-03-24 | Disposition: A | Discharge status: Home / Self Care | Payer: Medicare Other | Source: Ambulatory Visit | Attending: Internal Medicine | Admitting: Internal Medicine

## 2022-03-24 DIAGNOSIS — R918 Other nonspecific abnormal finding of lung field: Secondary | ICD-10-CM

## 2022-03-24 DIAGNOSIS — J439 Emphysema, unspecified: Secondary | ICD-10-CM | POA: Diagnosis not present

## 2022-03-24 DIAGNOSIS — R911 Solitary pulmonary nodule: Secondary | ICD-10-CM | POA: Diagnosis not present

## 2022-03-30 ENCOUNTER — Encounter: Payer: Self-pay | Admitting: Psychology

## 2022-03-30 DIAGNOSIS — M1991 Primary osteoarthritis, unspecified site: Secondary | ICD-10-CM | POA: Insufficient documentation

## 2022-03-30 DIAGNOSIS — D500 Iron deficiency anemia secondary to blood loss (chronic): Secondary | ICD-10-CM | POA: Insufficient documentation

## 2022-03-30 DIAGNOSIS — M545 Low back pain, unspecified: Secondary | ICD-10-CM | POA: Insufficient documentation

## 2022-03-30 DIAGNOSIS — E1121 Type 2 diabetes mellitus with diabetic nephropathy: Secondary | ICD-10-CM | POA: Insufficient documentation

## 2022-03-30 DIAGNOSIS — M8580 Other specified disorders of bone density and structure, unspecified site: Secondary | ICD-10-CM | POA: Insufficient documentation

## 2022-03-30 DIAGNOSIS — E782 Mixed hyperlipidemia: Secondary | ICD-10-CM | POA: Insufficient documentation

## 2022-03-30 DIAGNOSIS — N1831 Chronic kidney disease, stage 3a: Secondary | ICD-10-CM | POA: Insufficient documentation

## 2022-03-30 DIAGNOSIS — E1165 Type 2 diabetes mellitus with hyperglycemia: Secondary | ICD-10-CM | POA: Insufficient documentation

## 2022-03-30 DIAGNOSIS — Z91199 Patient's noncompliance with other medical treatment and regimen due to unspecified reason: Secondary | ICD-10-CM | POA: Insufficient documentation

## 2022-03-30 DIAGNOSIS — R634 Abnormal weight loss: Secondary | ICD-10-CM | POA: Insufficient documentation

## 2022-03-30 DIAGNOSIS — I2510 Atherosclerotic heart disease of native coronary artery without angina pectoris: Secondary | ICD-10-CM | POA: Insufficient documentation

## 2022-03-31 ENCOUNTER — Encounter: Payer: Self-pay | Admitting: Psychology

## 2022-03-31 ENCOUNTER — Ambulatory Visit (INDEPENDENT_AMBULATORY_CARE_PROVIDER_SITE_OTHER): Payer: Medicare Other | Admitting: Psychology

## 2022-03-31 ENCOUNTER — Ambulatory Visit: Payer: Medicare Other | Admitting: Psychology

## 2022-03-31 DIAGNOSIS — I6381 Other cerebral infarction due to occlusion or stenosis of small artery: Secondary | ICD-10-CM

## 2022-03-31 DIAGNOSIS — F0390 Unspecified dementia without behavioral disturbance: Secondary | ICD-10-CM

## 2022-03-31 DIAGNOSIS — F0280 Dementia in other diseases classified elsewhere without behavioral disturbance: Secondary | ICD-10-CM | POA: Diagnosis not present

## 2022-03-31 DIAGNOSIS — R4189 Other symptoms and signs involving cognitive functions and awareness: Secondary | ICD-10-CM

## 2022-03-31 HISTORY — DX: Dementia in other diseases classified elsewhere, unspecified severity, without behavioral disturbance, psychotic disturbance, mood disturbance, and anxiety: F02.80

## 2022-03-31 NOTE — Progress Notes (Signed)
? ?  Psychometrician Note ?  ?Cognitive testing was administered to Debra Skinner by Wallace Keller, B.S. (psychometrist) under the supervision of Dr. Newman Nickels, Ph.D., licensed psychologist on 03/31/2022. Debra Skinner did not appear overtly distressed by the testing session per behavioral observation or responses across self-report questionnaires. Rest breaks were offered.  ?  ?The battery of tests administered was selected by Dr. Newman Nickels, Ph.D. with consideration to Debra Skinner's current level of functioning, the nature of her symptoms, emotional and behavioral responses during interview, level of literacy, observed level of motivation/effort, and the nature of the referral question. This battery was communicated to the psychometrist. Communication between Dr. Newman Nickels, Ph.D. and the psychometrist was ongoing throughout the evaluation and Dr. Newman Nickels, Ph.D. was immediately accessible at all times. Dr. Newman Nickels, Ph.D. provided supervision to the psychometrist on the date of this service to the extent necessary to assure the quality of all services provided.  ?  ?Debra Skinner will return within approximately 1-2 weeks for an interactive feedback session with Dr. Milbert Coulter at which time her test performances, clinical impressions, and treatment recommendations will be reviewed in detail. Debra Skinner understands she can contact our office should she require our assistance before this time. ? ?A total of 115 minutes of billable time were spent face-to-face with Debra Skinner by the psychometrist. This includes both test administration and scoring time. Billing for these services is reflected in the clinical report generated by Dr. Newman Nickels, Ph.D. ? ?This note reflects time spent with the psychometrician and does not include test scores or any clinical interpretations made by Dr. Milbert Coulter. The full report will follow in a separate note.  ?

## 2022-03-31 NOTE — Progress Notes (Signed)
? ?NEUROPSYCHOLOGICAL EVALUATION ?[place] ?[place] Department of Neurology ? ?Date of Evaluation: March 31, 2022 ? ?Reason for Referral:  ? ?Debra Skinner is a 88 y.o. right-handed African-American female referred by  Levert Feinstein, M.D. , to characterize her current cognitive functioning and assist with diagnostic clarity and treatment planning in the context of subjective cognitive decline and concern for an evolving neurodegenerative illness.  ? ?Assessment and Plan:  ? ?Clinical Impression(s): ?Debra Skinner' pattern of performance is suggestive of significant impairment surrounding processing speed, cognitive flexibility, confrontation naming, and encoding (i.e., learning) aspects of memory. Additional deficits were exhibited across safety/judgment and retrieval aspects of memory. Visuospatial abilities were variable while she performed well relative to age-matched peers across basic attention and consolidation/recognition aspects of memory. Regarding ADLs, Debra Skinner' nieces have fully taken over medication, financial, and bill paying responsibilities. She has left the stove unattended several times to the extent that the fire department was called to her home and no longer drives due to cognitive concerns. Given ongoing cognitive and functional impairment, Debra Skinner certainly meets diagnostic criteria for a Major Neurocognitive Disorder ("dementia") at the present time. ? ?While the etiology of her current dementia presentation is somewhat unclear, consideration should be given to a primary vascular culprit (i.e., vascular dementia). Recent neuroimaging suggested moderate microvascular ischemic changes and a prior left corona radiata infarct, certainly in line with this presentation. From a cognitive perspective, impairment in processing speed, cognitive flexibility, and encoding/retrieval aspects of memory are fairly classic patterns within this presentation.  ? ?However, in considering her  functional decline over time, coupled with her family's report of observed cognitive decline, a co-occurring neurodegenerative illness also should be considered. I cannot rule out earlier stages of Alzheimer's disease at the present time. Behaviorally, this would cause symptoms observed by her nieces in Debra Skinner' day to day life and explain gradual, progressive decline in functioning. Deficits in memory, confrontation naming, and semantic fluency (especially as this was consistently worse than phonemic fluency) are all common patterns of performance in this illness. Across neuroimaging, progressive atrophy being more pronounced in the medial temporal lobes is a well known risk-factor for this illness. However, across memory testing, Debra Skinner was able to retain some information and performed well when provided yes/no cueing. This does not suggest a significant memory storage impairment, which is inconsistent with typically presenting Alzheimer's disease. As such, if this illness is present, it would appear to be in earlier stages at the present time. This would create a mixed dementia presentation (i.e., a combination of vascular changes to the brain and the presence of Alzheimer's disease).  ? ?Debra Skinner does not display behavioral characteristics of Lewy body dementia, frontotemporal dementia, or another more rare parkinsonian syndrome. As alluded to above, current impairments are not simply related to Debra Skinner' age. Continued medical monitoring will be important moving forward.  ? ?Recommendations: ?Debra Skinner could consider discussing dementia-level medications with Dr. Terrace Arabia given ongoing cognitive impairment. It is important to highlight that these medications have been shown to slow functional decline in some individuals and can only be effective if Debra Skinner consistently takes it as directed (as with all other medications). There is no current treatment which can stop or reverse cognitive decline when caused  by a neurodegenerative illness.  ? ?It will be necessary for Debra Skinner' family to continue fully managing medications and financial responsibilities as it is very unlikely that she will regain these abilities.  ? ?If not  already done, Debra Skinner and her family may want to discuss her wishes regarding durable power of attorney and medical decision making, so that she can have input into these choices.  ? ?While this will be challenging, I do have concerns surrounding Debra Skinner living independently given mobility concerns and ongoing cognitive impairment. I would advise that she and her family discuss future plans for caretaking and seek out community options for in home/residential care. Current documentation may be able to trigger certain insurance benefits related to ongoing dementia-level care (e.g., provision of home health aides). It should also be helpful in facilitating placement into an assistive living facility should Debra Skinner and her family make that ultimate decision.  ? ?It will be important for Debra Skinner to have another person with her when in situations where she may need to process information, weigh the pros and cons of different options, and make decisions, in order to ensure that she fully understands and recalls all information to be considered. ? ?I agree with her family's decision to have Debra Skinner fully abstain from driving based upon the results of current testing. ? ?Debra Skinner is encouraged to attend to lifestyle factors for brain health (e.g., regular physical exercise, good nutrition habits, regular participation in cognitively-stimulating activities, and general stress management techniques), which are likely to have benefits for both emotional adjustment and cognition. Optimal control of vascular risk factors (including safe cardiovascular exercise and adherence to dietary recommendations) is encouraged. Continued participation in activities which provide mental stimulation and social  interaction is also recommended.  ? ?Important information should be provided to Ms. Lafferty in written format in all instances. This information should be placed in a highly frequented and easily visible location within her home to promote recall. External strategies such as written notes in a consistently used memory journal, visual and nonverbal auditory cues such as a calendar on the refrigerator or appointments with alarm, such as on a cell phone, can also help maximize recall. ? ?To address problems with processing speed, she may wish to consider: ?  -Ensuring that she is alerted when essential material or instructions are being presented ?  -Adjusting the speed at which new information is presented ?  -Allowing for more time in comprehending, processing, and responding in conversation ? ?To address problems with fluctuating attention and executive dysfunction, she may wish to consider: ?  -Avoiding external distractions when needing to concentrate ?  -Limiting exposure to fast paced environments with multiple sensory demands ?  -Writing down complicated information and using checklists ?  -Attempting and completing one task at a time (i.e., no multi-tasking) ?  -Verbalizing aloud each step of a task to maintain focus ?  -Taking frequent breaks during the completion of steps/tasks to avoid fatigue ?  -Reducing the amount of information considered at one time ? ?Review of Records:  ? ?Ms. Kalisz [facility] Levert Feinstein(Yijun Yan, M.D.) on 10/24/2013 for an evaluation of memory loss and gait changes. No examples or description of cognitive decline was provided. Ms. Mylinda LatinaVines was noted to report a one month history of acute onset worsening gait where she will drag her right leg. She had also stopped driving about one month prior. Her niece had also started to perform bill paying responsibilities around that time, reportedly due to sudden onset of right-handed weakness. She had been receiving physical  therapy which was said to be helpful.  ? ?She was most recently seen by Dr. Terrace ArabiaYan on 10/02/2021  for follow-up. Prior to this her, her prior appointment with Dr. Terrace Arabia was on 05/25/2017. At that time, Dr. Terrace Arabia

## 2022-04-01 ENCOUNTER — Telehealth: Payer: Self-pay | Admitting: Neurology

## 2022-04-01 ENCOUNTER — Encounter: Payer: Self-pay | Admitting: Psychology

## 2022-04-01 DIAGNOSIS — R911 Solitary pulmonary nodule: Secondary | ICD-10-CM | POA: Diagnosis not present

## 2022-04-01 NOTE — Telephone Encounter (Signed)
Called and spoke with pt's niece, pt scheduled with Shanda Bumps NP on 04/13/22 at 1:15 pm. ?

## 2022-04-01 NOTE — Telephone Encounter (Signed)
Please give patient a follow-up with nurse practitioner to review evaluation from last visit ?

## 2022-04-06 ENCOUNTER — Ambulatory Visit (INDEPENDENT_AMBULATORY_CARE_PROVIDER_SITE_OTHER): Payer: Medicare Other | Admitting: Psychology

## 2022-04-06 DIAGNOSIS — F0280 Dementia in other diseases classified elsewhere without behavioral disturbance: Secondary | ICD-10-CM

## 2022-04-06 DIAGNOSIS — I6381 Other cerebral infarction due to occlusion or stenosis of small artery: Secondary | ICD-10-CM

## 2022-04-06 NOTE — Progress Notes (Signed)
? ?  Neuropsychology Feedback Session ?[place]. [facility] ?[place] Department of Neurology ? ?Reason for Referral:  ? ?Debra Skinner is a 88 y.o. right-handed African-American female referred by  Levert Feinstein, M.D. , to characterize her current cognitive functioning and assist with diagnostic clarity and treatment planning in the context of subjective cognitive decline and concern for an evolving neurodegenerative illness.  ? ?Feedback:  ? ?Debra Skinner completed a comprehensive neuropsychological evaluation on 03/31/2022. Please refer to that encounter for the full report and recommendations. Briefly, results suggested significant impairment surrounding processing speed, cognitive flexibility, confrontation naming, and encoding (i.e., learning) aspects of memory. Additional deficits were exhibited across safety/judgment and retrieval aspects of memory. While the etiology of her current dementia presentation is somewhat unclear, consideration should be given to a primary vascular culprit (i.e., vascular dementia). Recent neuroimaging suggested moderate microvascular ischemic changes and a prior left corona radiata infarct, certainly in line with this presentation. From a cognitive perspective, impairment in processing speed, cognitive flexibility, and encoding/retrieval aspects of memory are fairly classic patterns within this presentation. However, in considering her functional decline over time, coupled with her family's report of observed cognitive decline, a co-occurring neurodegenerative illness also should be considered. I cannot rule out earlier stages of Alzheimer's disease at the present time. Behaviorally, this would cause symptoms observed by her nieces in Debra Skinner' day to day life and explain gradual, progressive decline in functioning. Deficits in memory, confrontation naming, and semantic fluency (especially as this was consistently worse than phonemic fluency) are all common patterns of  performance in this illness. Across neuroimaging, progressive atrophy being more pronounced in the medial temporal lobes is a well known risk-factor for this illness. If this illness is present, it would appear to be in earlier stages at the present time. This would create a mixed dementia presentation (i.e., a combination of vascular changes to the brain and the presence of Alzheimer's disease).  ? ?Debra Skinner was accompanied by her nieces during the current feedback session.  Content of the current session focused on the results of her neuropsychological evaluation. Debra Skinner was given the opportunity to ask questions and her questions were answered. She was encouraged to reach out should additional questions arise. Three copies of her report were provided at the conclusion of the visit.  ? ?  ? ?25 minutes were spent conducting the current feedback session with Debra Skinner, billed as one unit [id number].  ?

## 2022-04-13 ENCOUNTER — Ambulatory Visit: Payer: Medicare Other | Admitting: Adult Health

## 2022-04-13 ENCOUNTER — Encounter: Payer: Self-pay | Admitting: Adult Health

## 2022-04-13 VITALS — BP 137/66 | HR 70 | Ht 66.0 in | Wt 168.0 lb

## 2022-04-13 DIAGNOSIS — I6381 Other cerebral infarction due to occlusion or stenosis of small artery: Secondary | ICD-10-CM

## 2022-04-13 DIAGNOSIS — F0280 Dementia in other diseases classified elsewhere without behavioral disturbance: Secondary | ICD-10-CM

## 2022-04-13 NOTE — Patient Instructions (Addendum)
Your Plan: ? ?Would recommend looking in to further assistance in the home or can discuss further with insurance regarding options for assisted living facilities or memory care facilities and further coverage ? ?Can use cameras around the home (inside and outside) to monitor her during the day and help ensure safety  ? ?Can consider use of Aricept or Namenda in the future if needed ? ?Very important for complete tobacco cessation, increase water intake, increase day time activity and good sleep at night ? ? ? ? ?Follow-up in 6 months or call earlier if needed ? ? ? ? ?Thank you for coming to see [place] at [facility]. I hope we have been able to provide you high quality care today. ? ?You may receive a patient satisfaction survey over the next few weeks. We would appreciate your feedback and comments so that we may continue to improve ourselves and the health of our patients. ? ?

## 2022-04-13 NOTE — Progress Notes (Signed)
?[facility] ?[address] ?[place]. [place] [address] ?[phone] ? ?     OFFICE FOLLOW UP NOTE ? ?[name] ?Date of Birth:  September 22, 1933 ?Medical Record Number:  [id number]  ? ? ?Primary neurologist: Dr. Krista Skinner ?Reason for visit: Dementia ? ? ? ?SUBJECTIVE: ? ? ?CHIEF COMPLAINT:  ?Chief Complaint  ?Patient presents with  ? Follow-up  ?  RM 2 with nieces Debra Skinner and Debra Skinner  ?Pt is well, pt states she has been stable, no changes.  ?Niece states her memory has slightly declined since   ? ? ?HPI:  ? ?Update 04/13/2022 JM: Patient returns for follow-up visit after prior visit 6 months ago with Dr. Krista Skinner regarding cognitive decline.  Completed neurocognitive evaluation on 03/31/2022 with Dr. Melvyn Skinner which showed significant impairment surrounding processing speed, cognitive flexibility, confrontation naming and encoding with additional deficits across sethi/judgment and retrieval aspects of memory.  Etiology of current dementia somewhat unclear but suspected primary vascular culprit but due to decline over time a co-occurring neurodegeneration illness should be considered possibly Alzheimer's disease especially with MRI 10/2021 showed progressive atrophy more pronounced in the medial temporal lobes.  If Alzheimer's present, appears in the earlier stages and would create a mixed dementia presentation.  Was recommended for family to continue to fully manage medications and financial responsibilities.  Also concern of patient living independently given mobility and cognitive impairment.  ? ?She is accompanied by her niece Debra Skinner and great niece Debra Skinner.  Patient believes her memory has been stable without any changes since prior visit.  Family believes cognition has slightly declined since prior visit.  Family continues to assist with medication set up (will call to remind her to take pills) and financial responsibilities. She does not cook, frequently orders pizza.  Admits to limited to no water intake,  frequently drinks soda or soft beverages. Reports 50+ year tobacco use with current use 0.5 PPD. Family concerned of EtOH use as liquor bottle recently found near couch although patient adamantly denies alcohol use. Limited daytime activity or functioning.  Difficulty sleeping at night and takes frequent daytime naps. Occasional agitation but no delusions, paranoia or hallucinations. Family looking into assistance at home to help with medications and cleaning. Family does not believe she would be eligible for facility placement as she has too many assets.  Both nieces work full-time jobs but help as much as they can and has neighbors checking on her.  [place] today 16/30 (prior 16/30). No further concerns at this time. ? ? ? ? ? ? ? ?History from Dr. Rhea Skinner OV note from 10/02/2021 provided for reference purposes only ?Debra Skinner is a 88 year old female, seen in request by primary care physician Dr. Ashby Skinner, Debra Skinner for evaluation of dementia, worsening gait abnormality, she is accompanied by her niece Debra Skinner at today's visit October 02, 2021, her great niece Debra Skinner was connected via the phone call. ?  ?I reviewed and summarized the referring note. PMHx. ?HTN ?DM ?Stroke ?  ?I saw her previously in 2018 for dementia and gait abnormality as well, ?  ?Personally reviewed MRI of the brain in 2013, no acute abnormality, generalized atrophy, moderate small vessel disease ? ?She had a college degree,,majored in business, worked at [employer] for many years, retired in 1994, she widowed in 1985, lives alone, has no children, quit driving few years ago, her niece also started paying her bill about 2018 ?  ?Previous laboratory evaluation showed no treatable etiology, ?  ?She still lives alone  at home, but had worsening memory loss, agitations, resistant to be helped, spent most of the time alone at home, Debra Skinner and [place] check on her couple times each week, she spent most of the time sitting down watching TV,  no longer able to cook, had left her stove unattended many times, ambulate with a cane, often forgetting her pills, get agitated easily, she denies hallucinations, ?  ?She has no power of attorney, Debra Skinner and Debra Skinner has increased concern of her staying alone, [facility] examination today is 16/30, spent lengthy time discussed with her to proceed next for her to be at a more safe environment. ? ? ? ? ? ? ? ? ? ? ?ROS:   ?14 system review of systems performed and negative with exception of those listed in HPI ? ?PMH:  ?Past Medical History:  ?Diagnosis Date  ? Abnormal EKG 09/22/2013  ? Atherosclerotic heart disease of native coronary artery without angina pectoris   ? Chronic kidney disease, stage 3a   ? Diabetic renal disease   ? Essential hypertension 09/22/2013  ? Gait abnormality 10/02/2021  ? Hyperglycemia due to type 2 diabetes mellitus   ? Iron deficiency anemia due to chronic blood loss   ? Lacunar infarction   ? left corona radiata  ? Low back pain   ? Major neurocognitive disorder due to multiple etiologies 03/31/2022  ? Mixed hyperlipidemia   ? Noncompliance with treatment   ? Osteopenia   ? Primary osteoarthritis   ? Tobacco use 10/26/2014  ? Type II diabetes mellitus 09/22/2013  ? Weakness   ? ? ?PSH:  ?Past Surgical History:  ?Procedure Laterality Date  ? CATARACT EXTRACTION Left   ? 10-07-16  ? toe nail    ? ? ?Social History:  ?Social History  ? ?Socioeconomic History  ? Marital status: Widowed  ?  Spouse name: Not on file  ? Number of children: 0  ? Years of education: 36  ? Highest education level: Bachelor's degree (e.g., BA, AB, BS)  ?Occupational History  ?  Employer: RETIRED  ?  Comment: Retired  ?Tobacco Use  ? Smoking status: Some Days  ?  Packs/day: 0.50  ?  Types: Cigarettes  ? Smokeless tobacco: Never  ?Substance and Sexual Activity  ? Alcohol use: No  ?  Alcohol/week: 0.0 standard drinks  ? Drug use: No  ? Sexual activity: Not on file  ?Other Topics Concern  ? Not on file  ?Social History  Narrative  ? Patient is retired and lives at   Home alone.   ? Education- College   ? Right handed.  ? Caffeine-  Coffee one daily, soda-  And tea daily.  ?   ?   ?   ? ?Social Determinants of Health  ? ?Financial Resource Strain: Not on file  ?Food Insecurity: Not on file  ?Transportation Needs: Not on file  ?Physical Activity: Not on file  ?Stress: Not on file  ?Social Connections: Not on file  ?Intimate Partner Violence: Not on file  ? ? ?Family History:  ?Family History  ?Problem Relation Age of Onset  ? Cervical cancer Mother   ? High blood pressure Father   ? Aneurysm Father   ? ? ?Medications:   ?Current Outpatient Medications on File Prior to Visit  ?Medication Sig Dispense Refill  ? amLODipine (NORVASC) 5 MG tablet Take 5 mg by mouth daily.    ? clopidogrel (PLAVIX) 75 MG tablet Take 1 tablet (75 mg total) by mouth daily  with breakfast. 90 tablet 3  ? Fe Fum-FePoly-Vit C-Vit B3 (INTEGRA) 62.5-62.5-40-3 MG CAPS Take 1 capsule by mouth daily.  3  ? glimepiride (AMARYL) 4 MG tablet Take 4 mg by mouth daily.  11  ? lisinopril-hydrochlorothiazide (PRINZIDE,ZESTORETIC) 20-12.5 MG per tablet Take 1 tablet by mouth daily.    ? Dulaglutide (TRULICITY [place]) Inject into the skin. (Patient not taking: Reported on 04/13/2022)    ? ?No current facility-administered medications on file prior to visit.  ? ? ?Allergies:   ?Allergies  ?Allergen Reactions  ? Penicillins   ?  Facial edema  ? ? ? ? ?OBJECTIVE: ? ?Physical Exam ? ?Vitals:  ? 04/13/22 1300  ?BP: 137/66  ?Pulse: 70  ?Weight: 168 lb (76.2 kg)  ?Height: 5\' 6"  (1.676 m)  ? ?Body mass index is 27.12 kg/m?Marland Kitchen ?No results found. ? ?General: frail pleasant elderly African-American female, seated, in no evident distress ?Head: head normocephalic and atraumatic.   ?Neck: supple with no carotid or supraclavicular bruits ?Cardiovascular: regular rate and rhythm, no murmurs ?Musculoskeletal: no deformity ?Skin:  no rash/petichiae ?Vascular:  Normal pulses all extremities ?   ?Neurologic Exam ?Mental Status: Awake and fully alert. Fluent speech and language. Oriented to place and time. Recent memory impaired and remote memory intact. Attention span, concentration and fund of knowledge im

## 2022-04-15 ENCOUNTER — Encounter: Payer: Self-pay | Admitting: Pulmonary Disease

## 2022-04-15 ENCOUNTER — Ambulatory Visit: Payer: Medicare Other | Admitting: Pulmonary Disease

## 2022-04-15 VITALS — BP 142/68 | HR 63 | Temp 98.2°F | Ht 66.0 in | Wt 169.6 lb

## 2022-04-15 DIAGNOSIS — R911 Solitary pulmonary nodule: Secondary | ICD-10-CM | POA: Diagnosis not present

## 2022-04-15 DIAGNOSIS — F17200 Nicotine dependence, unspecified, uncomplicated: Secondary | ICD-10-CM | POA: Diagnosis not present

## 2022-04-15 DIAGNOSIS — R918 Other nonspecific abnormal finding of lung field: Secondary | ICD-10-CM

## 2022-04-15 NOTE — Patient Instructions (Signed)
Thank you for visiting Dr. Tonia Brooms at [facility]. ?Today we recommend the following: ? ?Orders Placed This Encounter  ?Procedures  ? NM PET Image Initial (PI) Skull Base To Thigh (F-18 FDG)  ? ?See [place] after your PET complete.  ? ?Return in about 4 weeks (around 05/13/2022) for with Kandice Robinsons, NP, or Dr. Tonia Brooms. ? ? ? ?Please do your part to reduce the spread of COVID-19.  ? ?

## 2022-04-15 NOTE — Progress Notes (Signed)
? ?Synopsis: Referred in May 2023 for pulmonary nodule by Deland Pretty, MD ? ?Subjective:  ? ?PATIENT ID: Debra Skinner GENDER: female DOB: [birth year], MRN: [id number] ? ?Chief Complaint  ?Patient presents with  ? Consult  ?  Patient is here to talk about lung nodule  ? ? ?This is an 88 year old female, past medical history of stroke, hypoglycemia related to diabetes, hypertension.  Patient with a longstanding history of tobacco use.  Patient has smoked since she was a teenager.  She had a recent CT scan of the chest which showed bilateral pulmonary nodules.  She has a nodule that is small groundglass in nature on the left side which is very faint to see on CT imaging.  And she has a more dominant nodule on the right lung that measures approximately 1.2 cm it does have spiculated margins and it does have a solid component under soft tissue windows.  We reviewed this today in the office with the patient's family at bedside.  We talked about the probability of malignancy and based on the solitary pulmonary risk calculator it is approximately 60% risk of malignancy. ? ? ? ?Past Medical History:  ?Diagnosis Date  ? Abnormal EKG 09/22/2013  ? Atherosclerotic heart disease of native coronary artery without angina pectoris   ? Chronic kidney disease, stage 3a   ? Diabetic renal disease   ? Essential hypertension 09/22/2013  ? Gait abnormality 10/02/2021  ? Hyperglycemia due to type 2 diabetes mellitus   ? Iron deficiency anemia due to chronic blood loss   ? Lacunar infarction   ? left corona radiata  ? Low back pain   ? Major neurocognitive disorder due to multiple etiologies 03/31/2022  ? Mixed hyperlipidemia   ? Noncompliance with treatment   ? Osteopenia   ? Primary osteoarthritis   ? Tobacco use 10/26/2014  ? Type II diabetes mellitus 09/22/2013  ? Weakness   ?  ? ?Family History  ?Problem Relation Age of Onset  ? Cervical cancer Mother   ? High blood pressure Father   ? Aneurysm Father   ?  ? ?Past Surgical History:   ?Procedure Laterality Date  ? CATARACT EXTRACTION Left   ? 10-07-16  ? toe nail    ? ? ?Social History  ? ?Socioeconomic History  ? Marital status: Widowed  ?  Spouse name: Not on file  ? Number of children: 0  ? Years of education: 16  ? Highest education level: Bachelor's degree (e.g., BA, AB, BS)  ?Occupational History  ?  Employer: RETIRED  ?  Comment: Retired  ?Tobacco Use  ? Smoking status: Every Day  ?  Packs/day: 0.50  ?  Types: Cigarettes  ? Smokeless tobacco: Never  ?Substance and Sexual Activity  ? Alcohol use: No  ?  Alcohol/week: 0.0 standard drinks  ? Drug use: No  ? Sexual activity: Not on file  ?Other Topics Concern  ? Not on file  ?Social History Narrative  ? Patient is retired and lives at   Home alone.   ? Education- College   ? Right handed.  ? Caffeine-  Coffee one daily, soda-  And tea daily.  ?   ?   ?   ? ?Social Determinants of Health  ? ?Financial Resource Strain: Not on file  ?Food Insecurity: Not on file  ?Transportation Needs: Not on file  ?Physical Activity: Not on file  ?Stress: Not on file  ?Social Connections: Not on file  ?Intimate Partner Violence: Not  on file  ?  ? ?Allergies  ?Allergen Reactions  ? Penicillins   ?  Facial edema  ?  ? ?Outpatient Medications Prior to Visit  ?Medication Sig Dispense Refill  ? amLODipine (NORVASC) 5 MG tablet Take 5 mg by mouth daily.    ? clopidogrel (PLAVIX) 75 MG tablet Take 1 tablet (75 mg total) by mouth daily with breakfast. 90 tablet 3  ? Fe Fum-FePoly-Vit C-Vit B3 (INTEGRA) 62.5-62.5-40-3 MG CAPS Take 1 capsule by mouth daily.  3  ? glimepiride (AMARYL) 4 MG tablet Take 4 mg by mouth daily.  11  ? lisinopril-hydrochlorothiazide (PRINZIDE,ZESTORETIC) 20-12.5 MG per tablet Take 1 tablet by mouth daily.    ? Dulaglutide (TRULICITY [place]) Inject into the skin. (Patient not taking: Reported on 04/13/2022)    ? ?No facility-administered medications prior to visit.  ? ? ?Review of Systems  ?Constitutional:  Negative for chills, fever, malaise/fatigue  and weight loss.  ?HENT:  Negative for hearing loss, sore throat and tinnitus.   ?Eyes:  Negative for blurred vision and double vision.  ?Respiratory:  Negative for cough, hemoptysis, sputum production, shortness of breath, wheezing and stridor.   ?Cardiovascular:  Negative for chest pain, palpitations, orthopnea, leg swelling and PND.  ?Gastrointestinal:  Negative for abdominal pain, constipation, diarrhea, heartburn, nausea and vomiting.  ?Genitourinary:  Negative for dysuria, hematuria and urgency.  ?Musculoskeletal:  Negative for joint pain and myalgias.  ?Skin:  Negative for itching and rash.  ?Neurological:  Negative for dizziness, tingling, weakness and headaches.  ?Endo/Heme/Allergies:  Negative for environmental allergies. Does not bruise/bleed easily.  ?Psychiatric/Behavioral:  Negative for depression. The patient is not nervous/anxious and does not have insomnia.   ?All other systems reviewed and are negative. ? ? ?Objective:  ?Physical Exam ?Vitals reviewed.  ?Constitutional:   ?   General: She is not in acute distress. ?   Appearance: She is well-developed.  ?HENT:  ?   Head: Normocephalic and atraumatic.  ?Eyes:  ?   General: No scleral icterus. ?   Conjunctiva/sclera: Conjunctivae normal.  ?   Pupils: Pupils are equal, round, and reactive to light.  ?Neck:  ?   Vascular: No JVD.  ?   Trachea: No tracheal deviation.  ?Cardiovascular:  ?   Rate and Rhythm: Normal rate and regular rhythm.  ?   Heart sounds: Normal heart sounds. No murmur heard. ?Pulmonary:  ?   Effort: Pulmonary effort is normal. No tachypnea, accessory muscle usage or respiratory distress.  ?   Breath sounds: No stridor. No wheezing, rhonchi or rales.  ?Abdominal:  ?   Palpations: Abdomen is soft.  ?Musculoskeletal:     ?   General: No tenderness.  ?   Cervical back: Neck supple.  ?Lymphadenopathy:  ?   Cervical: No cervical adenopathy.  ?Skin: ?   General: Skin is warm and dry.  ?   Capillary Refill: Capillary refill takes less than 2  seconds.  ?   Findings: No rash.  ?Neurological:  ?   Mental Status: She is alert and oriented to person, place, and time.  ?Psychiatric:     ?   Behavior: Behavior normal.  ? ? ? ?Vitals:  ? 04/15/22 1611  ?BP: (!) 142/68  ?Pulse: 63  ?Temp: 98.2 ?F (36.8 ?C)  ?TempSrc: Oral  ?SpO2: 100%  ?Weight: 169 lb 9.6 oz (76.9 kg)  ?Height: 5\' 6"  (1.676 m)  ? ?100% on RA ?BMI Readings from Last 3 Encounters:  ?04/15/22 27.37 kg/m?  ?04/13/22  27.12 kg/m?  ?10/02/21 27.28 kg/m?  ? ?Wt Readings from Last 3 Encounters:  ?04/15/22 169 lb 9.6 oz (76.9 kg)  ?04/13/22 168 lb (76.2 kg)  ?10/02/21 169 lb (76.7 kg)  ? ? ? ?CBC ?No results found for: WBC, RBC, HGB, HCT, PLT, MCV, MCH, MCHC, RDW, LYMPHSABS, MONOABS, EOSABS, BASOSABS ? ? ?Chest Imaging: ?03/26/2022 CT chest: ?1.2 x 1.3 cm irregular spiculated nodule concerning for malignancy. ?Groundglass lesion within the left chest. ?The patient's images have been independently reviewed by me.   ? ?Pulmonary Functions Testing Results: ?   ? View : No data to display.  ?  ?  ?  ? ? ?FeNO:  ? ?Pathology:  ? ?Echocardiogram:  ? ?Heart Catheterization:  ?   ?Assessment & Plan:  ? ?  ICD-10-CM   ?1. Lung nodule  R91.1 NM PET Image Initial (PI) Skull Base To Thigh (F-18 FDG)  ?  ?2. Ground glass opacity present on imaging of lung  R91.8   ?  ?3. Tobacco use  F17.200   ?  ? ? ?Discussion: ? ?This is an 88 year old female longstanding history of tobacco abuse since a teenager.  Was found to have an incidental right-sided pulmonary nodule.  Is 1.2 cm in size spiculated margins concerning for primary bronchogenic carcinoma.  She also has a small groundglass lesion in the left side which also could represent a indolent neoplasm ? ?Plan: ?Today in the office we discussed the risk benefits and alternatives of proceeding with further evaluation of a small nodule. ?Family is interested in the least knowing whether or not we are dealing with malignancy. ?We understand that she is not a surgical  candidate but would potentially be candidate for SBRT. ?Even if we were to find whether or not this is a malignancy. ?They would like to start with having a nuclear medicine pet image which I think will help de

## 2022-04-24 ENCOUNTER — Ambulatory Visit (HOSPITAL_COMMUNITY)
Admission: RE | Admit: 2022-04-24 | Discharge: 2022-04-24 | Disposition: A | Discharge status: Home / Self Care | Payer: Medicare Other | Source: Ambulatory Visit | Attending: Pulmonary Disease | Admitting: Pulmonary Disease

## 2022-04-24 DIAGNOSIS — R911 Solitary pulmonary nodule: Secondary | ICD-10-CM | POA: Insufficient documentation

## 2022-04-24 LAB — GLUCOSE, CAPILLARY: Glucose-Capillary: 121 mg/dL — ABNORMAL HIGH (ref 70–99)

## 2022-04-24 MED ORDER — FLUDEOXYGLUCOSE F - 18 (FDG) INJECTION
8.5000 | Freq: Once | INTRAVENOUS | Status: AC
  Administered 2022-04-24: 8.45 via INTRAVENOUS

## 2022-05-13 ENCOUNTER — Ambulatory Visit (INDEPENDENT_AMBULATORY_CARE_PROVIDER_SITE_OTHER): Payer: Medicare Other | Admitting: Adult Health

## 2022-05-13 ENCOUNTER — Encounter: Payer: Self-pay | Admitting: Adult Health

## 2022-05-13 VITALS — BP 140/70 | HR 73 | Ht 66.0 in | Wt 167.4 lb

## 2022-05-13 DIAGNOSIS — F17200 Nicotine dependence, unspecified, uncomplicated: Secondary | ICD-10-CM

## 2022-05-13 DIAGNOSIS — R911 Solitary pulmonary nodule: Secondary | ICD-10-CM

## 2022-05-13 DIAGNOSIS — R918 Other nonspecific abnormal finding of lung field: Secondary | ICD-10-CM | POA: Diagnosis not present

## 2022-05-13 NOTE — Progress Notes (Signed)
@Patient  ID: Debra Skinner, female    DOB: June 17, 1933, 88 y.o.   MRN: [id number]  Chief Complaint  Patient presents with   Follow-up    Referring provider: Merrilee Seashore, MD  HPI: 88 year old female active smoker seen for pulmonary consult Apr 15, 2022 for abnormal CT chest with pulmonary nodules  TEST/EVENTS :  03/26/2022 CT chest: 1.2 x 1.3 cm irregular spiculated nodule concerning for malignancy. Groundglass lesion within the left chest.  05/13/2022 Follow up : Lung nodule  Patient presents for an 1 month follow-up.  Patient was seen last visit for a abnormal CT chest.  CT chest March 24, 2022 showed a ill-defined groundglass opacity in the lingula measuring 8 x 5.3 mm and a right middle lobe nodule 1.2 x 1 x 1.3 cm.  Fine spiculations noted.  2.4 cm thyroid gland nodule. Patient was set up for a PET scan that was completed on Apr 24, 2022 that showed focal hypermetabolism of the left thyroid lobe SUV max 11, decrease in right middle lobe nodule from 1.2 x 1.0 cm to 0.6 x 0.4 cm.  SUV max 0.7 10 mm left axillary node with low-level hypermetabolism, no other suspicious pulmonary nodules We went over her PET scan results.  Patient says she is doing well.  This was considered an incidental finding.  She has no known underlying lung problems.  Denies asthma COPD.  She denies any shortness of breath or cough.  She does continue to smoke.  We discussed smoking cessation however patient says she is not interested in quitting smoking  Allergies  Allergen Reactions   Penicillins     Facial edema    Immunization History  Administered Date(s) Administered   Influenza-Unspecified 10/07/2018    Past Medical History:  Diagnosis Date   Abnormal EKG 09/22/2013   Atherosclerotic heart disease of native coronary artery without angina pectoris    Chronic kidney disease, stage 3a    Diabetic renal disease    Essential hypertension 09/22/2013   Gait abnormality 10/02/2021    Hyperglycemia due to type 2 diabetes mellitus    Iron deficiency anemia due to chronic blood loss    Lacunar infarction    left corona radiata   Low back pain    Major neurocognitive disorder due to multiple etiologies 03/31/2022   Mixed hyperlipidemia    Noncompliance with treatment    Osteopenia    Primary osteoarthritis    Tobacco use 10/26/2014   Type II diabetes mellitus 09/22/2013   Weakness     Tobacco History: Social History   Tobacco Use  Smoking Status Every Day   Packs/day: 0.50   Types: Cigarettes  Smokeless Tobacco Never  Tobacco Comments   Smoking at the most 1 carton 2 weeks.  05/13/22 hfb.   Ready to quit: Not Answered Counseling given: Not Answered Tobacco comments: Smoking at the most 1 carton 2 weeks.  05/13/22 hfb.   Outpatient Medications Prior to Visit  Medication Sig Dispense Refill   amLODipine (NORVASC) 5 MG tablet Take 5 mg by mouth daily.     clopidogrel (PLAVIX) 75 MG tablet Take 1 tablet (75 mg total) by mouth daily with breakfast. 90 tablet 3   Dulaglutide (TRULICITY [place]) Inject into the skin.     Fe Fum-FePoly-Vit C-Vit B3 (INTEGRA) 62.5-62.5-40-3 MG CAPS Take 1 capsule by mouth daily.  3   glimepiride (AMARYL) 4 MG tablet Take 4 mg by mouth daily.  11   lisinopril-hydrochlorothiazide (PRINZIDE,ZESTORETIC) 20-12.5 MG per tablet Take  1 tablet by mouth daily.     No facility-administered medications prior to visit.     Review of Systems:   Constitutional:   No  weight loss, night sweats,  Fevers, chills, fatigue, or  lassitude.  HEENT:   No headaches,  Difficulty swallowing,  Tooth/dental problems, or  Sore throat,                No sneezing, itching, ear ache, nasal congestion, post nasal drip,   CV:  No chest pain,  Orthopnea, PND, swelling in lower extremities, anasarca, dizziness, palpitations, syncope.   GI  No heartburn, indigestion, abdominal pain, nausea, vomiting, diarrhea, change in bowel habits, loss of appetite, bloody stools.    Resp: No shortness of breath with exertion or at rest.  No excess mucus, no productive cough,  No non-productive cough,  No coughing up of blood.  No change in color of mucus.  No wheezing.  No chest wall deformity  Skin: no rash or lesions.  GU: no dysuria, change in color of urine, no urgency or frequency.  No flank pain, no hematuria   MS:  No joint pain or swelling.  No decreased range of motion.  No back pain.    Physical Exam  BP 140/70 (BP Location: Left Arm, Patient Position: Sitting, Cuff Size: Normal)   Pulse 73   Ht 5\' 6"  (1.676 m)   Wt 167 lb 6.4 oz (75.9 kg)   SpO2 99%   BMI 27.02 kg/m   GEN: A/Ox3; pleasant , NAD, well nourished    HEENT:  /AT,  NOSE-clear, THROAT-clear, no lesions, no postnasal drip or exudate noted.   NECK:  Supple w/ fair ROM; no JVD; normal carotid impulses w/o bruits; no thyromegaly or nodules palpated; no lymphadenopathy.    RESP  Clear  P & A; w/o, wheezes/ rales/ or rhonchi. no accessory muscle use, no dullness to percussion  CARD:  RRR, no m/r/g, no peripheral edema, pulses intact, no cyanosis or clubbing.  GI:   Soft & nt; nml bowel sounds; no organomegaly or masses detected.   Musco: Warm bil, no deformities or joint swelling noted.   Neuro: alert, no focal deficits noted.    Skin: Warm, no lesions or rashes    Lab Results:  CBC No results found for: WBC, RBC, HGB, HCT, PLT, MCV, MCH, MCHC, RDW, LYMPHSABS, MONOABS, EOSABS, BASOSABS  BMET No results found for: NA, K, CL, CO2, GLUCOSE, BUN, CREATININE, CALCIUM, GFRNONAA, GFRAA  BNP No results found for: BNP  ProBNP No results found for: PROBNP  Imaging: NM PET Image Initial (PI) Skull Base To Thigh (F-18 FDG)  Addendum Date: 04/26/2022   ADDENDUM REPORT: 04/26/2022 08:27 ADDENDUM: As noted in the body of the report, there is a focal hypermetabolic lesion in the left thyroid lobe. Previous CT chest of 03/24/2022 characterized a 2.4 cm heterogeneous nodule in the  right thyroid gland. Given hypermetabolic uptake in the left thyroid lobe today, recommend thyroid US and biopsy (ref: J Am Coll Radiol. 2015 Feb;12(2): 143-50). Electronically Signed   By: Misty Stanley M.D.   On: 04/26/2022 08:27   Result Date: 04/26/2022 CLINICAL DATA:  Initial treatment strategy for pulmonary nodule. EXAM: NUCLEAR MEDICINE PET SKULL BASE TO THIGH TECHNIQUE: 8.5 mCi F-18 FDG was injected intravenously. Full-ring PET imaging was performed from the skull base to thigh after the radiotracer. CT data was obtained and used for attenuation correction and anatomic localization. Fasting blood glucose: 121 mg/dl COMPARISON:  Chest CT  03/24/2022 FINDINGS: Mediastinal blood pool activity: SUV max 1.4 Liver activity: SUV max NA NECK: Focal hypermetabolism associated with the left thyroid lobe appears to map to a nodule measuring about 1.4 cm. SUV max = 11.0. Incidental CT findings: none CHEST: 10 mm medial left axillary node on image 52/4 shows low level FDG accumulation with SUV max = 2.5. Right middle lobe nodule of concern on chest CT 03/24/2022 has decreased in size in the interval measuring 0.6 x 0.4 cm today compared to 1.2 x 1.0 cm previously. SUV max = 0.7. No other suspicious hypermetabolic pulmonary nodule or mass. No hypermetabolic mediastinal or hilar lymphadenopathy. Incidental CT findings: Coronary artery calcification is evident. ABDOMEN/PELVIS: No abnormal hypermetabolic activity within the liver, pancreas, adrenal glands, or spleen. No hypermetabolic lymph nodes in the abdomen or pelvis. Incidental CT findings: There is mild atherosclerotic calcification of the abdominal aorta without aneurysm. SKELETON: No focal hypermetabolic activity to suggest skeletal metastasis. Incidental CT findings: none IMPRESSION: 1. Interval decrease in size of the right middle lobe pulmonary nodule of concern on previous chest CT of 03/24/2022. No appreciable hypermetabolism on PET imaging today. Both findings  are reassuring for resolving infectious/inflammatory etiology. Consider follow-up CT chest in 3 months to ensure continued clearing. 2. 10 mm left axillary node with low level hypermetabolism, borderline for infectious/inflammatory versus neoplastic etiology. Mammographic correlation may prove helpful. No other hypermetabolic lymphadenopathy in the chest. 3.  Aortic Atherosclerois (ICD10-170.0) Electronically Signed: By: Misty Stanley M.D. On: 04/26/2022 08:22          View : No data to display.          No results found for: NITRICOXIDE      Assessment & Plan:   No problem-specific Assessment & Plan notes found for this encounter.     Rexene Edison, NP 05/13/2022

## 2022-05-13 NOTE — Patient Instructions (Addendum)
Follow up with Primary MD regarding Thyroid nodule and Left axillary node on PET scan .  CT chest (w/o contrast) in 3 months to follow lung nodule  Work on not smoking.  Follow up in 3 months with Dr. Valeta Harms after CT chest and As needed

## 2022-05-14 DIAGNOSIS — R918 Other nonspecific abnormal finding of lung field: Secondary | ICD-10-CM | POA: Insufficient documentation

## 2022-05-14 NOTE — Assessment & Plan Note (Signed)
Smoking cessation discussed 

## 2022-05-14 NOTE — Assessment & Plan Note (Signed)
Lung nodules noted on previous CT chest.  Subsequent PET scan completed on Apr 24, 2022 showed decrease in right middle lobe nodule and SUV max at 0.7.  No other suspicious nodules noted.  We discussed that we will need a repeat CT chest in 3 months.  Incidental findings were a left thyroid nodule with hypermetabolic activity.  And a mild hypermetabolic left axillary node.  We discussed in detail that she will need a follow-up visit with her primary care provider to determine if additional imaging is indicated most likely will need a thyroid ultrasound and updated mammogram  Plan  Patient Instructions  Follow up with Primary MD regarding Thyroid nodule and Left axillary node on PET scan .  CT chest (w/o contrast) in 3 months to follow lung nodule  Work on not smoking.  Follow up in 3 months with Dr. Valeta Harms after CT chest and As needed

## 2022-05-15 ENCOUNTER — Other Ambulatory Visit: Payer: Self-pay | Admitting: Internal Medicine

## 2022-05-15 DIAGNOSIS — E041 Nontoxic single thyroid nodule: Secondary | ICD-10-CM

## 2022-05-21 ENCOUNTER — Ambulatory Visit
Admission: RE | Admit: 2022-05-21 | Discharge: 2022-05-21 | Disposition: A | Discharge status: Home / Self Care | Payer: Medicare Other | Source: Ambulatory Visit | Attending: Internal Medicine | Admitting: Internal Medicine

## 2022-05-21 DIAGNOSIS — E041 Nontoxic single thyroid nodule: Secondary | ICD-10-CM

## 2022-06-02 ENCOUNTER — Other Ambulatory Visit: Payer: Self-pay | Admitting: Internal Medicine

## 2022-06-02 DIAGNOSIS — E782 Mixed hyperlipidemia: Secondary | ICD-10-CM | POA: Diagnosis not present

## 2022-06-02 DIAGNOSIS — E118 Type 2 diabetes mellitus with unspecified complications: Secondary | ICD-10-CM | POA: Diagnosis not present

## 2022-06-02 DIAGNOSIS — E041 Nontoxic single thyroid nodule: Secondary | ICD-10-CM

## 2022-06-02 DIAGNOSIS — I2510 Atherosclerotic heart disease of native coronary artery without angina pectoris: Secondary | ICD-10-CM | POA: Diagnosis not present

## 2022-06-02 DIAGNOSIS — I10 Essential (primary) hypertension: Secondary | ICD-10-CM | POA: Diagnosis not present

## 2022-06-04 DIAGNOSIS — I2510 Atherosclerotic heart disease of native coronary artery without angina pectoris: Secondary | ICD-10-CM | POA: Diagnosis not present

## 2022-06-04 DIAGNOSIS — E118 Type 2 diabetes mellitus with unspecified complications: Secondary | ICD-10-CM | POA: Diagnosis not present

## 2022-06-04 DIAGNOSIS — E782 Mixed hyperlipidemia: Secondary | ICD-10-CM | POA: Diagnosis not present

## 2022-06-04 DIAGNOSIS — I10 Essential (primary) hypertension: Secondary | ICD-10-CM | POA: Diagnosis not present

## 2022-06-11 ENCOUNTER — Ambulatory Visit
Admission: RE | Admit: 2022-06-11 | Discharge: 2022-06-11 | Disposition: A | Discharge status: Home / Self Care | Payer: Medicare Other | Source: Ambulatory Visit | Attending: Internal Medicine | Admitting: Internal Medicine

## 2022-06-11 ENCOUNTER — Other Ambulatory Visit: Payer: Self-pay | Admitting: Internal Medicine

## 2022-06-11 ENCOUNTER — Other Ambulatory Visit (HOSPITAL_COMMUNITY)
Admission: RE | Admit: 2022-06-11 | Discharge: 2022-06-11 | Disposition: A | Discharge status: Home / Self Care | Payer: Medicare Other | Source: Ambulatory Visit | Attending: Student | Admitting: Student

## 2022-06-11 DIAGNOSIS — E041 Nontoxic single thyroid nodule: Secondary | ICD-10-CM | POA: Diagnosis not present

## 2022-06-11 DIAGNOSIS — E042 Nontoxic multinodular goiter: Secondary | ICD-10-CM | POA: Diagnosis not present

## 2022-06-12 LAB — CYTOLOGY - NON PAP

## 2022-06-25 DIAGNOSIS — E782 Mixed hyperlipidemia: Secondary | ICD-10-CM | POA: Diagnosis not present

## 2022-06-25 DIAGNOSIS — I10 Essential (primary) hypertension: Secondary | ICD-10-CM | POA: Diagnosis not present

## 2022-06-25 DIAGNOSIS — E118 Type 2 diabetes mellitus with unspecified complications: Secondary | ICD-10-CM | POA: Diagnosis not present

## 2022-06-25 DIAGNOSIS — Z0000 Encounter for general adult medical examination without abnormal findings: Secondary | ICD-10-CM | POA: Diagnosis not present

## 2022-08-05 ENCOUNTER — Ambulatory Visit
Admission: RE | Admit: 2022-08-05 | Discharge: 2022-08-05 | Disposition: A | Discharge status: Home / Self Care | Payer: Medicare Other | Source: Ambulatory Visit | Attending: Adult Health | Admitting: Adult Health

## 2022-08-05 ENCOUNTER — Other Ambulatory Visit: Payer: Medicare Other

## 2022-08-05 DIAGNOSIS — R911 Solitary pulmonary nodule: Secondary | ICD-10-CM

## 2022-08-05 DIAGNOSIS — R918 Other nonspecific abnormal finding of lung field: Secondary | ICD-10-CM | POA: Diagnosis not present

## 2022-08-05 DIAGNOSIS — J439 Emphysema, unspecified: Secondary | ICD-10-CM | POA: Diagnosis not present

## 2022-08-05 DIAGNOSIS — I700 Atherosclerosis of aorta: Secondary | ICD-10-CM | POA: Diagnosis not present

## 2022-08-07 ENCOUNTER — Ambulatory Visit (INDEPENDENT_AMBULATORY_CARE_PROVIDER_SITE_OTHER): Payer: Medicare Other | Admitting: Pulmonary Disease

## 2022-08-07 ENCOUNTER — Encounter: Payer: Self-pay | Admitting: Pulmonary Disease

## 2022-08-07 VITALS — BP 140/60 | HR 62 | Ht 66.0 in | Wt 177.6 lb

## 2022-08-07 DIAGNOSIS — R600 Localized edema: Secondary | ICD-10-CM | POA: Diagnosis not present

## 2022-08-07 DIAGNOSIS — R918 Other nonspecific abnormal finding of lung field: Secondary | ICD-10-CM

## 2022-08-07 DIAGNOSIS — R911 Solitary pulmonary nodule: Secondary | ICD-10-CM

## 2022-08-07 NOTE — Progress Notes (Signed)
Synopsis: Referred in May 2023 for pulmonary nodule by Georgianne Fick, MD  Subjective:   PATIENT ID: Debra Skinner GENDER: female DOB: Apr 26, 1933, MRN: [id number]  Chief Complaint  Patient presents with   Follow-up    This is an 88 year old female, past medical history of stroke, hypoglycemia related to diabetes, hypertension.  Patient with a longstanding history of tobacco use.  Patient has smoked since she was a teenager.  She had a recent CT scan of the chest which showed bilateral pulmonary nodules.  She has a nodule that is small groundglass in nature on the left side which is very faint to see on CT imaging.  And she has a more dominant nodule on the right lung that measures approximately 1.2 cm it does have spiculated margins and it does have a solid component under soft tissue windows.  We reviewed this today in the office with the patient's family at bedside.  We talked about the probability of malignancy and based on the solitary pulmonary risk calculator it is approximately 60% risk of malignancy.  OV 08/07/2022:Patient here today for follow-up regarding lung nodule.  Patient had a CT scan of the chest for short-term follow-up after having a PET.  Patient's PET scan was completed in May 2023 this revealed a thyroid nodule and she was referred for thyroid ultrasound.  Patient's lung nodule in the right middle lobe had interval decrease in size with a repeat 55-month noncontrasted CT follow-up.  August 2023 patient had a repeat CT scan of the chest with interval resolution of the right middle lobe pulmonary nodule.  Has also minimal residual groundglass opacity in the left upper lobe that is only 6 mm in size.  She has repeat noncontrasted scan in 1 year already scheduled.      Past Medical History:  Diagnosis Date   Abnormal EKG 09/22/2013   Atherosclerotic heart disease of native coronary artery without angina pectoris    Chronic kidney disease, stage 3a    Diabetic renal  disease    Essential hypertension 09/22/2013   Gait abnormality 10/02/2021   Hyperglycemia due to type 2 diabetes mellitus    Iron deficiency anemia due to chronic blood loss    Lacunar infarction    left corona radiata   Low back pain    Major neurocognitive disorder due to multiple etiologies 03/31/2022   Mixed hyperlipidemia    Noncompliance with treatment    Osteopenia    Primary osteoarthritis    Tobacco use 10/26/2014   Type II diabetes mellitus 09/22/2013   Weakness      Family History  Problem Relation Age of Onset   Cervical cancer Mother    High blood pressure Father    Aneurysm Father      Past Surgical History:  Procedure Laterality Date   CATARACT EXTRACTION Left    10-07-16   toe nail      Social History   Socioeconomic History   Marital status: Widowed    Spouse name: Not on file   Number of children: 0   Years of education: 16   Highest education level: Bachelor's degree (e.g., BA, AB, BS)  Occupational History    Employer: RETIRED    Comment: Retired  Tobacco Use   Smoking status: Every Day    Packs/day: 0.50    Types: Cigarettes   Smokeless tobacco: Never   Tobacco comments:    Smoking at the most 1 carton 2 weeks.  08/07/22 hfb.  Substance and Sexual  Activity   Alcohol use: No    Alcohol/week: 0.0 standard drinks of alcohol   Drug use: No   Sexual activity: Not on file  Other Topics Concern   Not on file  Social History Narrative   Patient is retired and lives at   Home alone.    Education- College    Right handed.   Caffeine-  Coffee one daily, soda-  And tea daily.            Social Determinants of Health   Financial Resource Strain: Not on file  Food Insecurity: Not on file  Transportation Needs: Not on file  Physical Activity: Not on file  Stress: Not on file  Social Connections: Not on file  Intimate Partner Violence: Not on file     Allergies  Allergen Reactions   Penicillins     Facial edema     Outpatient  Medications Prior to Visit  Medication Sig Dispense Refill   amLODipine (NORVASC) 5 MG tablet Take 5 mg by mouth daily. (Patient not taking: Reported on 08/07/2022)     clopidogrel (PLAVIX) 75 MG tablet Take 1 tablet (75 mg total) by mouth daily with breakfast. (Patient not taking: Reported on 08/07/2022) 90 tablet 3   Dulaglutide (TRULICITY [place]) Inject into the skin.     Fe Fum-FePoly-Vit C-Vit B3 (INTEGRA) 62.5-62.5-40-3 MG CAPS Take 1 capsule by mouth daily.  3   glimepiride (AMARYL) 4 MG tablet Take 4 mg by mouth daily.  11   lisinopril-hydrochlorothiazide (PRINZIDE,ZESTORETIC) 20-12.5 MG per tablet Take 1 tablet by mouth daily.     No facility-administered medications prior to visit.    ROS   Objective:  Physical Exam Vitals reviewed.  Constitutional:      General: She is not in acute distress.    Appearance: She is well-developed.  HENT:     Head: Normocephalic and atraumatic.  Eyes:     General: No scleral icterus.    Conjunctiva/sclera: Conjunctivae normal.     Pupils: Pupils are equal, round, and reactive to light.  Neck:     Vascular: No JVD.     Trachea: No tracheal deviation.  Cardiovascular:     Rate and Rhythm: Normal rate and regular rhythm.     Heart sounds: Normal heart sounds. No murmur heard. Pulmonary:     Effort: Pulmonary effort is normal. No tachypnea, accessory muscle usage or respiratory distress.     Breath sounds: No stridor. No wheezing, rhonchi or rales.  Abdominal:     Palpations: Abdomen is soft.  Musculoskeletal:        General: No tenderness.     Cervical back: Neck supple.     Right lower leg: Edema present.     Left lower leg: Edema present.  Lymphadenopathy:     Cervical: No cervical adenopathy.  Skin:    General: Skin is warm and dry.     Capillary Refill: Capillary refill takes less than 2 seconds.     Findings: No rash.  Neurological:     Mental Status: She is alert and oriented to person, place, and time.  Psychiatric:         Behavior: Behavior normal.      Vitals:   08/07/22 1645  BP: (!) 140/60  Pulse: 62  SpO2: 99%  Weight: 177 lb 9.6 oz (80.6 kg)  Height: 5\' 6"  (1.676 m)   99% on RA BMI Readings from Last 3 Encounters:  08/07/22 28.67 kg/m  05/13/22 27.02 kg/m  04/15/22 27.37 kg/m   Wt Readings from Last 3 Encounters:  08/07/22 177 lb 9.6 oz (80.6 kg)  05/13/22 167 lb 6.4 oz (75.9 kg)  04/15/22 169 lb 9.6 oz (76.9 kg)     CBC No results found for: "WBC", "RBC", "HGB", "HCT", "PLT", "MCV", "MCH", "MCHC", "RDW", "LYMPHSABS", "MONOABS", "EOSABS", "BASOSABS"   Chest Imaging: 03/26/2022 CT chest: 1.2 x 1.3 cm irregular spiculated nodule concerning for malignancy. Groundglass lesion within the left chest. The patient's images have been independently reviewed by me.    Pulmonary Functions Testing Results:     No data to display          FeNO:   Pathology:   Echocardiogram:   Heart Catheterization:     Assessment & Plan:     ICD-10-CM   1. Lung nodule  R91.1 CT CHEST WO CONTRAST    2. Ground glass opacity present on imaging of lung  R91.8     3. Lower extremity edema  R60.0       Discussion:  This is an 88 year old female longstanding history of tobacco use since teenager was found to have incidental right-sided pulmonary nodules 1.2 cm in size spiculated margin concerning for malignancy follow-up imaging showed partial resolution and a PET scan.  We had a follow-up short-term CT was also shows near complete resolution of the right side however she does have a groundglass opacity small 6 mm lesion on the left.  Plan: We are going to move forward with repeat imaging. We will have a repeat noncontrast CT scan in 1 year. She does have lower extremity edema and this seems to be a chronic issue family thinks is related to not wearing her stockings keeping feet elevated or taking her medications. Encouraged follow-up with primary care provider to help address these  things. She is going to see [place] either as a virtual visit or in person after her scan in 1 year.   Current Outpatient Medications:    amLODipine (NORVASC) 5 MG tablet, Take 5 mg by mouth daily. (Patient not taking: Reported on 08/07/2022), Disp: , Rfl:    clopidogrel (PLAVIX) 75 MG tablet, Take 1 tablet (75 mg total) by mouth daily with breakfast. (Patient not taking: Reported on 08/07/2022), Disp: 90 tablet, Rfl: 3   Dulaglutide (TRULICITY [place]), Inject into the skin., Disp: , Rfl:    Fe Fum-FePoly-Vit C-Vit B3 (INTEGRA) 62.5-62.5-40-3 MG CAPS, Take 1 capsule by mouth daily., Disp: , Rfl: 3   glimepiride (AMARYL) 4 MG tablet, Take 4 mg by mouth daily., Disp: , Rfl: 11   lisinopril-hydrochlorothiazide (PRINZIDE,ZESTORETIC) 20-12.5 MG per tablet, Take 1 tablet by mouth daily., Disp: , Rfl:    Josephine Igo, DO [facility] 08/07/2022 5:03 PM

## 2022-10-09 IMAGING — US US THYROID
1 series · 12 of 25 positions shown · non-contrast
Comparison: PET 04/24/2022

CLINICAL DATA: 88-year-old female with left thyroid nodule

EXAM:
THYROID ULTRASOUND
TECHNIQUE: Ultrasound examination of the thyroid gland and adjacent soft
tissues was performed.

[Series 1: us thyroid · 0.05mm/px · 12 of 75 slices shown]
[im 4/75]
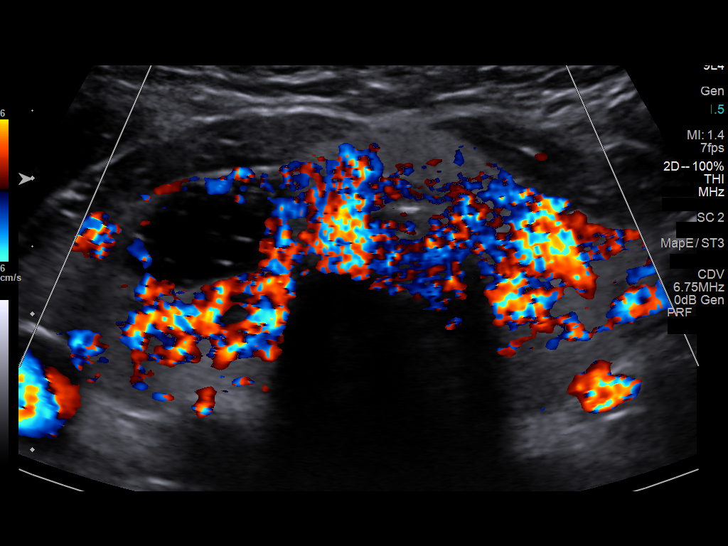
[im 10/75]
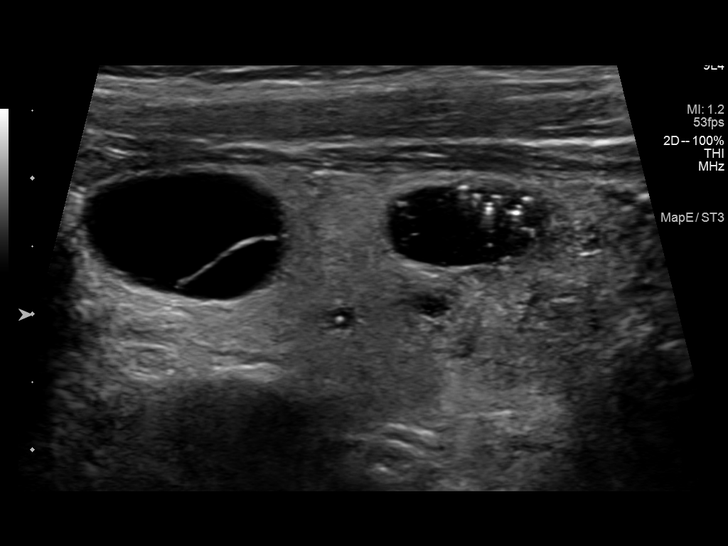
[im 16/75]
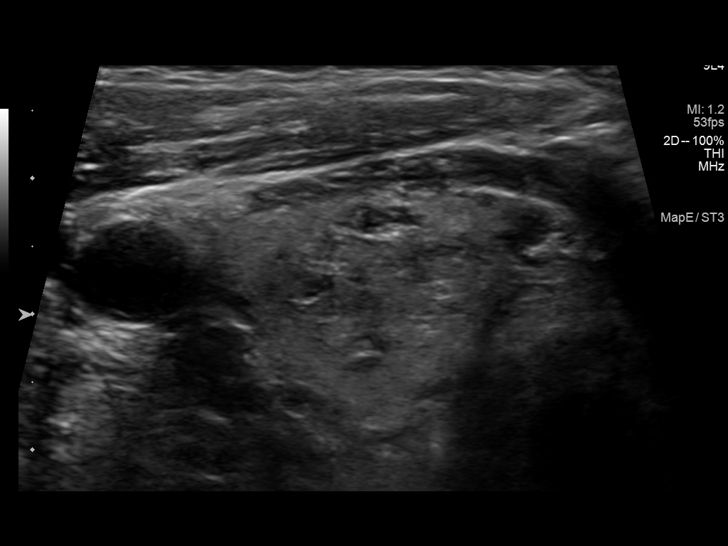
[im 22/75]
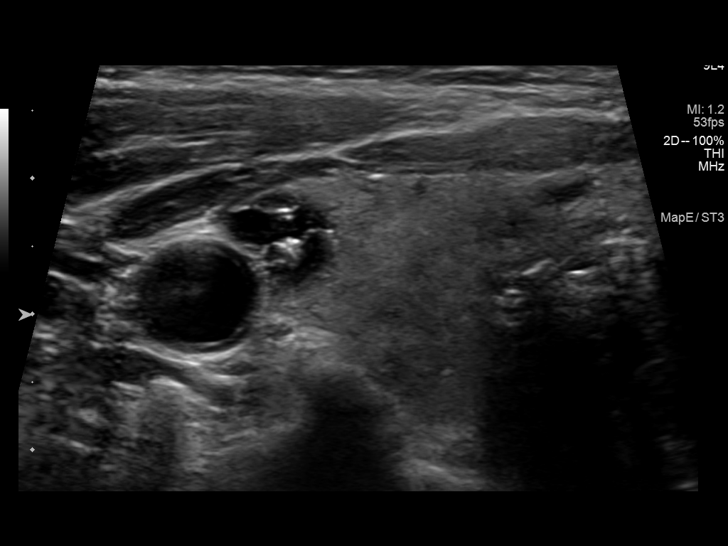
[im 28/75]
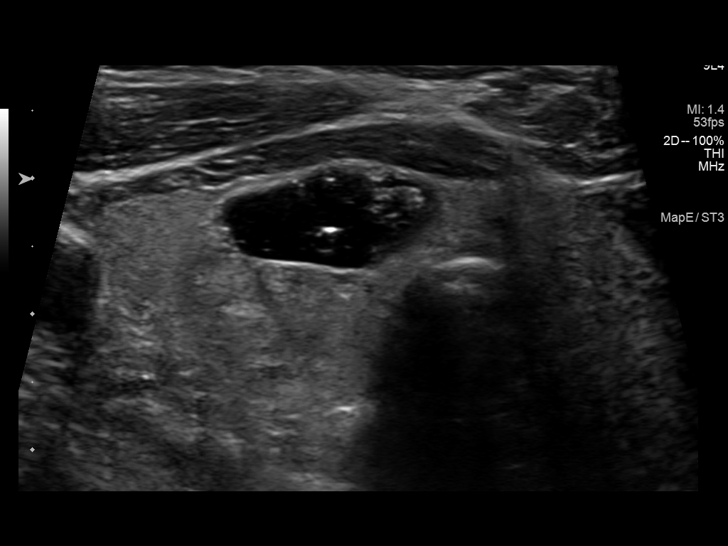
[im 34/75]
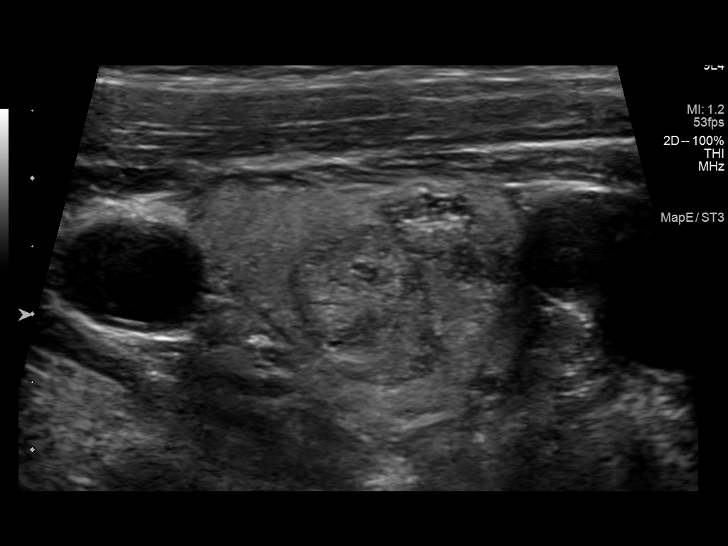
[im 41/75]
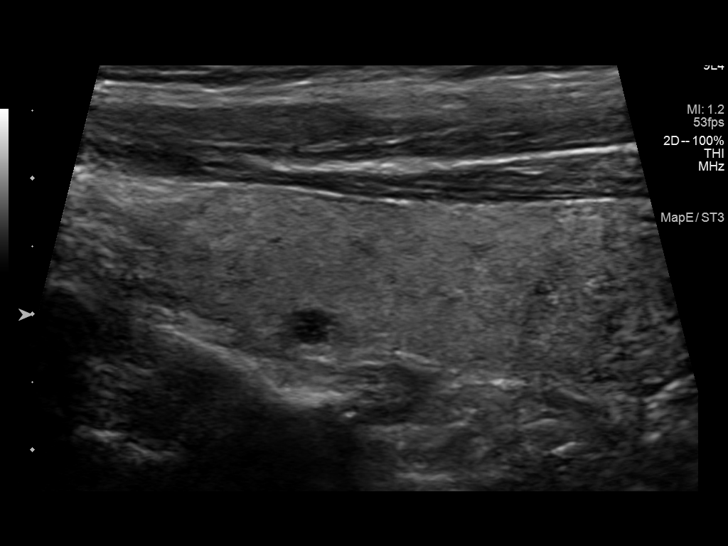
[im 47/75]
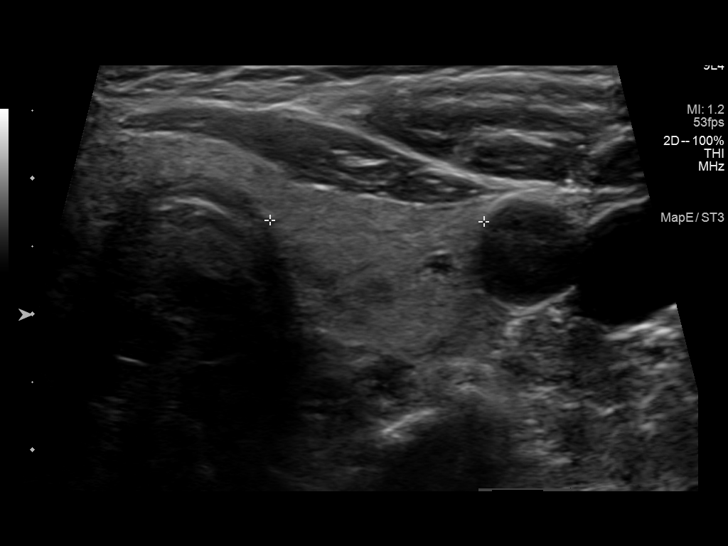
[im 53/75]
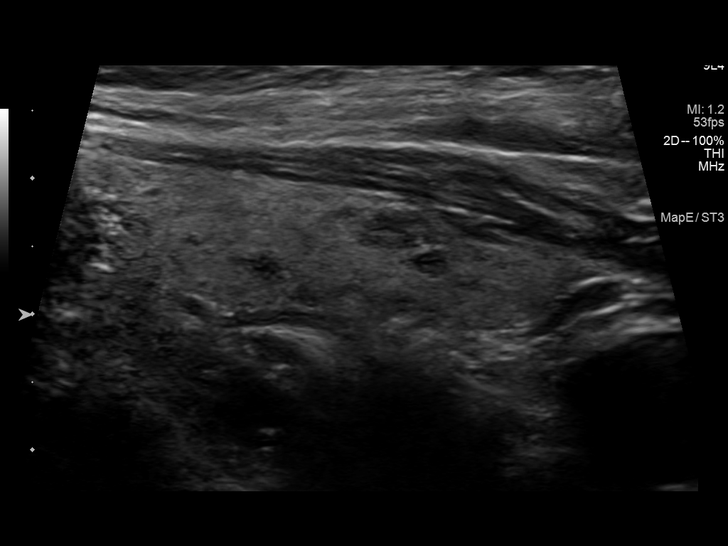
[im 59/75]
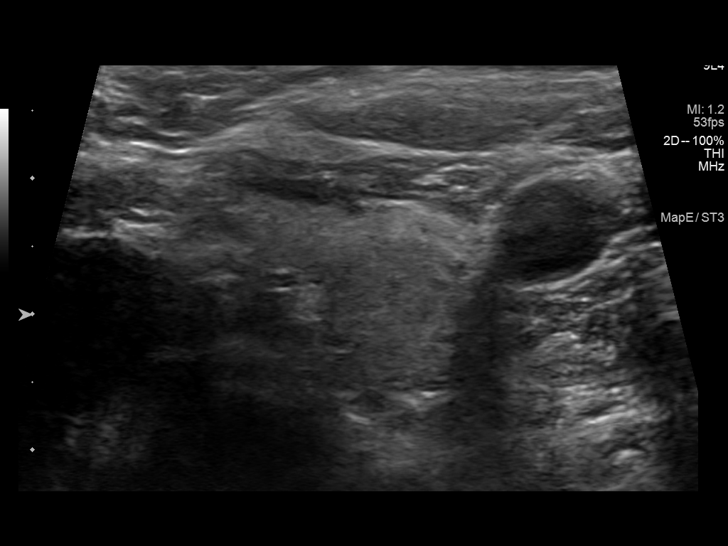
[im 65/75]
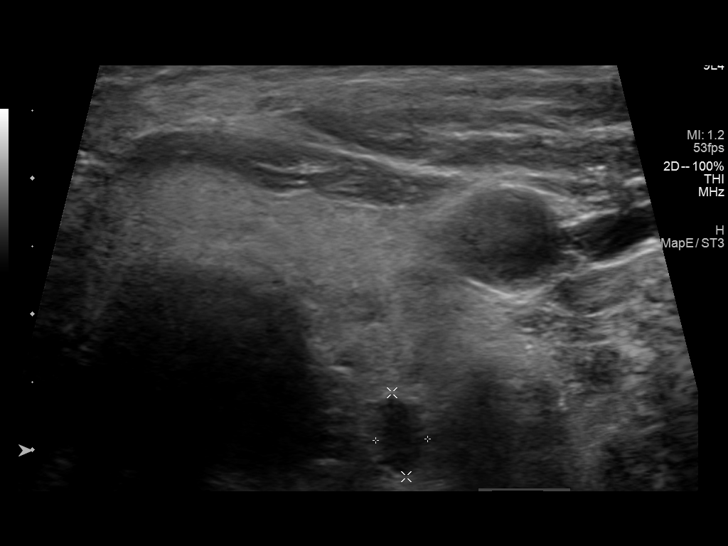
[im 71/75]
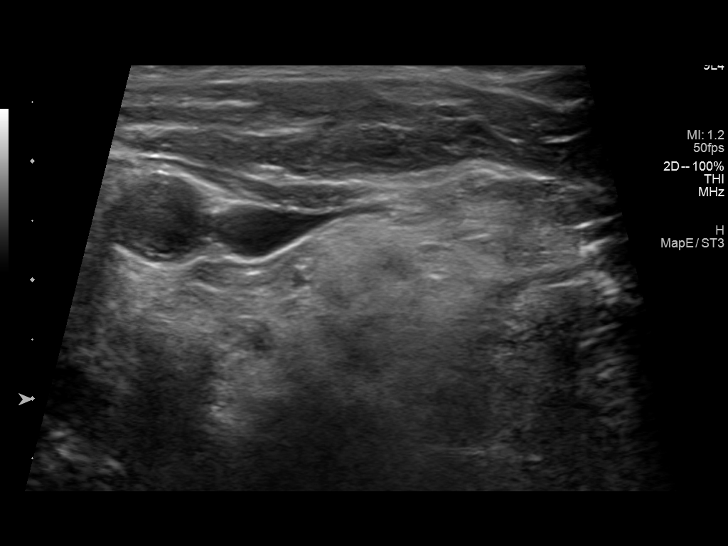

[12 of 25 positions shown; findings below may reference images not displayed]

FINDINGS: Parenchymal Echotexture: Mildly heterogenous

Isthmus: 0.4 cm

Right lobe: 4.2 cm x 1.8 cm x 2.6 cm

Left lobe: 4.4 cm x 1.6 cm x 1.6 cm

_________________________________________________________

Estimated total number of nodules >/= 1 cm: 4

Number of spongiform nodules >/=  2 cm not described below (TR1): 0

Number of mixed cystic and solid nodules >/= 1.5 cm not described
below (TR2): 0

_________________________________________________________

Nodule # 1:

Location: Right; Superior

Maximum size: 1.5 cm; Other 2 dimensions: 1.1 cm x 1.0 cm

Composition: cystic/almost completely cystic (0)

Echogenicity: anechoic (0)

Shape: not taller-than-wide (0)

Margins: smooth (0)

Echogenic foci: none (0)

ACR TI-RADS total points: 0.

ACR TI-RADS risk category: TR1 (0-1 points).

ACR TI-RADS recommendations:

Cystic nodule does not meet criteria for surveillance or biopsy

_________________________________________________________

Nodule # 2:

Location: Right; Mid

Maximum size: 1.7 cm; Other 2 dimensions: 1.4 cm x 0.8 cm

Composition: cannot determine (2)

Echogenicity: very hypoechoic (3)

Shape: not taller-than-wide (0)

Margins: smooth (0)

Echogenic foci: punctate echogenic foci (3)

ACR TI-RADS total points: 8.

ACR TI-RADS risk category: TR5 (>/= 7 points).

ACR TI-RADS recommendations:

Nodule meets criteria for biopsy

_________________________________________________________

Nodule # 3:

Location: Right; Inferior

Maximum size: 1.3 cm; Other 2 dimensions: 1.1 cm x 0.9 cm

Composition: solid/almost completely solid (2)

Echogenicity: isoechoic (1)

Shape: not taller-than-wide (0)

Margins: ill-defined (0)

Echogenic foci: none (0)

ACR TI-RADS total points: 3.

ACR TI-RADS risk category: TR3 (3 points).

ACR TI-RADS recommendations:

Nodule does not meet criteria for surveillance or biopsy

_________________________________________________________

Nodule labeled 4 is a component of the previously described nodule,
not separate.

Nodule # 5:

Location: Left; Inferior

Maximum size: 0.6 cm; Other 2 dimensions: 0.6 cm x 0.3 cm

Composition: cannot determine (2)

Echogenicity: hypoechoic (2)

Shape: not taller-than-wide (0)

Margins: ill-defined (0)

Echogenic foci: none (0)

ACR TI-RADS total points: 4.

ACR TI-RADS risk category: TR4 (4-6 points).

ACR TI-RADS recommendations:

Nodule does not meet criteria for surveillance

_________________________________________________________

Nodule # 6:

Location: Left; Inferior

Maximum size: 1.7 cm; Other 2 dimensions: 1.2 cm x 1.1 cm

Composition: solid/almost completely solid (2)

Echogenicity: isoechoic (1)

Shape: not taller-than-wide (0)

Margins: ill-defined (0)

Echogenic foci: none (0)

ACR TI-RADS total points: 3.

ACR TI-RADS risk category: TR3 (3 points).

ACR TI-RADS recommendations:

Nodule meets criteria for surveillance.

_________________________________________________________

No adenopathy
IMPRESSION: Multinodular thyroid.

The nodule that corresponds to the prior PET-CT is labeled 6.
Technically this meets criteria for surveillance, as designated by
the newly established ACR TI-RADS criteria, however, given prior
avidity on the PET-CT referral for biopsy is reasonable.

Right thyroid nodule (labeled 2, 1.7 cm, TR 5) meets criteria for
biopsy, as designated by the newly established ACR TI-RADS criteria,
and referral for biopsy is recommended.

Recommendations follow those established by the new ACR TI-RADS
criteria ([HOSPITAL] 3034;[DATE]).

## 2022-10-20 NOTE — Progress Notes (Unsigned)
[facility] [address] [place]. [place] [address] [phone]       OFFICE FOLLOW UP NOTE  [name] Date of Birth:  06/17/1933 Medical Record Number:  [id number]    Primary neurologist: Dr. Terrace Skinner Reason for visit: Dementia    SUBJECTIVE:   CHIEF COMPLAINT:  No chief complaint on file.   HPI:   Update 12/21/2021 Debra Skinner: Patient returns for 20-month dementia follow-up.       History provided for reference purposes only Update 04/13/2022 Debra Skinner: Patient returns for follow-up visit after prior visit 6 months ago with Dr. Terrace Skinner regarding cognitive decline.  Completed neurocognitive evaluation on 03/31/2022 with Dr. Milbert Skinner which showed significant impairment surrounding processing speed, cognitive flexibility, confrontation naming and encoding with additional deficits across sethi/judgment and retrieval aspects of memory.  Etiology of current dementia somewhat unclear but suspected primary vascular culprit but due to decline over time a co-occurring neurodegeneration illness should be considered possibly Alzheimer's disease especially with MRI 10/2021 showed progressive atrophy more pronounced in the medial temporal lobes.  If Alzheimer's present, appears in the earlier stages and would create a mixed dementia presentation.  Was recommended for family to continue to fully manage medications and financial responsibilities.  Also concern of patient living independently given mobility and cognitive impairment.   She is accompanied by her niece Debra Skinner and great niece Debra Skinner.  Patient believes her memory has been stable without any changes since prior visit.  Family believes cognition has slightly declined since prior visit.  Family continues to assist with medication set up (will call to remind her to take pills) and financial responsibilities. She does not cook, frequently orders pizza.  Admits to limited to no water intake, frequently drinks soda or soft beverages. Reports  50+ year tobacco use with current use 0.5 PPD. Family concerned of EtOH use as liquor bottle recently found near couch although patient adamantly denies alcohol use. Limited daytime activity or functioning.  Difficulty sleeping at night and takes frequent daytime naps. Occasional agitation but no delusions, paranoia or hallucinations. Family looking into assistance at home to help with medications and cleaning. Family does not believe she would be eligible for facility placement as she has too many assets.  Both nieces work full-time jobs but help as much as they can and has neighbors checking on her.  [place] today 16/30 (prior 16/30). No further concerns at this time.   History from Debra Skinner OV note from 10/02/2021 provided for reference purposes only Debra Skinner is a 88 year old female, seen in request by primary care physician Dr. Nicholos Skinner, Debra Skinner for evaluation of dementia, worsening gait abnormality, she is accompanied by her niece Debra Skinner at today's visit October 02, 2021, her great niece Debra Skinner was connected via the phone call.   I reviewed and summarized the referring note. PMHx. HTN DM Stroke   I saw her previously in 2018 for dementia and gait abnormality as well,   Personally reviewed MRI of the brain in 2013, no acute abnormality, generalized atrophy, moderate small vessel disease  She had a college degree,,majored in business, worked at [employer] for many years, retired in 1994, she widowed in 1985, lives alone, has no children, quit driving few years ago, her niece also started paying her bill about 2018   Previous laboratory evaluation showed no treatable etiology,   She still lives alone at home, but had worsening memory loss, agitations, resistant to be helped, spent most of the time alone at home, [place]  and [place] check on her couple times each week, she spent most of the time sitting down watching TV, no longer able to cook, had left her stove unattended many  times, ambulate with a cane, often forgetting her pills, get agitated easily, she denies hallucinations,   She has no power of attorney, Debra Skinner and Debra Skinner has increased concern of her staying alone, MoCA examination today is 16/30, spent lengthy time discussed with her to proceed next for her to be at a more safe environment.           ROS:   14 system review of systems performed and negative with exception of those listed in HPI  PMH:  Past Medical History:  Diagnosis Date   Abnormal EKG 09/22/2013   Atherosclerotic heart disease of native coronary artery without angina pectoris    Chronic kidney disease, stage 3a    Diabetic renal disease    Essential hypertension 09/22/2013   Gait abnormality 10/02/2021   Hyperglycemia due to type 2 diabetes mellitus    Iron deficiency anemia due to chronic blood loss    Lacunar infarction    left corona radiata   Low back pain    Major neurocognitive disorder due to multiple etiologies 03/31/2022   Mixed hyperlipidemia    Noncompliance with treatment    Osteopenia    Primary osteoarthritis    Tobacco use 10/26/2014   Type II diabetes mellitus 09/22/2013   Weakness     PSH:  Past Surgical History:  Procedure Laterality Date   CATARACT EXTRACTION Left    10-07-16   toe nail      Social History:  Social History   Socioeconomic History   Marital status: Widowed    Spouse name: Not on file   Number of children: 0   Years of education: 16   Highest education level: Bachelor's degree (e.g., BA, AB, BS)  Occupational History    Employer: RETIRED    Comment: Retired  Tobacco Use   Smoking status: Every Day    Packs/day: 0.50    Types: Cigarettes   Smokeless tobacco: Never   Tobacco comments:    Smoking at the most 1 carton 2 weeks.  08/07/22 hfb.  Substance and Sexual Activity   Alcohol use: No    Alcohol/week: 0.0 standard drinks of alcohol   Drug use: No   Sexual activity: Not on file  Other Topics Concern   Not on  file  Social History Narrative   Patient is retired and lives at   Home alone.    Education- College    Right handed.   Caffeine-  Coffee one daily, soda-  And tea daily.            Social Determinants of Health   Financial Resource Strain: Not on file  Food Insecurity: Not on file  Transportation Needs: Not on file  Physical Activity: Not on file  Stress: Not on file  Social Connections: Not on file  Intimate Partner Violence: Not on file    Family History:  Family History  Problem Relation Age of Onset   Cervical cancer Mother    High blood pressure Father    Aneurysm Father     Medications:   Current Outpatient Medications on File Prior to Visit  Medication Sig Dispense Refill   amLODipine (NORVASC) 5 MG tablet Take 5 mg by mouth daily. (Patient not taking: Reported on 08/07/2022)     clopidogrel (PLAVIX) 75 MG tablet Take 1 tablet (75  mg total) by mouth daily with breakfast. (Patient not taking: Reported on 08/07/2022) 90 tablet 3   Dulaglutide (TRULICITY [place]) Inject into the skin.     Fe Fum-FePoly-Vit C-Vit B3 (INTEGRA) 62.5-62.5-40-3 MG CAPS Take 1 capsule by mouth daily.  3   glimepiride (AMARYL) 4 MG tablet Take 4 mg by mouth daily.  11   lisinopril-hydrochlorothiazide (PRINZIDE,ZESTORETIC) 20-12.5 MG per tablet Take 1 tablet by mouth daily.     No current facility-administered medications on file prior to visit.    Allergies:   Allergies  Allergen Reactions   Penicillins     Facial edema      OBJECTIVE:  Physical Exam  There were no vitals filed for this visit.  There is no height or weight on file to calculate BMI. No results found.  General: frail pleasant elderly African-American female, seated, in no evident distress Head: head normocephalic and atraumatic.   Neck: supple with no carotid or supraclavicular bruits Cardiovascular: regular rate and rhythm, no murmurs Musculoskeletal: no deformity Skin:  no rash/petichiae Vascular:  Normal pulses  all extremities   Neurologic Exam Mental Status: Awake and fully alert. Fluent speech and language. Oriented to place and time. Recent memory impaired and remote memory intact. Attention span, concentration and fund of knowledge impaired. Mood and affect appropriate.     04/13/2022    1:09 PM 10/02/2021   10:09 AM  Montreal Cognitive Assessment   Visuospatial/ Executive (0/5) 0 0  Naming (0/3) 3 2  Attention: Read list of digits (0/2) 2 2  Attention: Read list of letters (0/1) 1 1  Attention: Serial 7 subtraction starting at 100 (0/3) 2 1  Language: Repeat phrase (0/2) 1 1  Language : Fluency (0/1) 0 1  Abstraction (0/2) 2 2  Delayed Recall (0/5) 1 0  Orientation (0/6) 4 6  Total 16 16   Cranial Nerves: Pupils equal, briskly reactive to light. Extraocular movements full without nystagmus. Visual fields full to confrontation. Hearing intact. Facial sensation intact. Face, tongue, palate moves normally and symmetrically.  Motor: Normal bulk and tone. Normal strength in all tested extremity muscles Sensory.: intact to touch , pinprick , position and vibratory sensation.  Coordination: Rapid alternating movements normal in all extremities. Finger-to-nose and heel-to-shin performed accurately bilaterally. Gait and Station: Arises from chair with mild difficulty. Stance is normal. Cautious unsteady gait with use of cane.  Tandem walk and heel toe not attempted.  Reflexes: 1+ and symmetric. Toes downgoing.         ASSESSMENT: Debra Skinner is a 88 y.o. 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 MR brain 10/2021 showed generalized cortical atrophy a little more pronounced in the medial temporal lobes in the insular cortex which has progressed since 2013 as well as chronic lacunar infarcts     PLAN:  Dementia:  stable MOCA testing since prior visit.  Multiple factors which can contribute to further cognitive  decline such as being sedentary, tobacco use, likely EtOH use, poor diet and poor sleep habits. Fully discussed during visit - patient has no interest in quitting smoking - she understands risk. Discussed increased risk of further cognitive decline as well as increased risk of recurrent strokes.   Provided information to nieces for community resources as well as Child psychotherapistsocial worker information for further assistance in the home as well as day programs and possible need of higher level of care in the future as there is concern of patient  living alone.  Can consider use of Aricept or Namenda in the future but currently does not take medication consistently therefore would be of no benefit. This was also discussed with nieces.    Follow up in 6 months or call earlier if needed   CC:  PCP: Georgianne Fick, MD    I spent 38 minutes of face-to-face and non-face-to-face time with patient and family.  This included previsit chart review, lab review, study review, electronic health record documentation, patient and family education regarding dementia with review and discussion of MoCA testing, review of neurocognitive evaluation and further discussion, increased risks of further cognitive decline and answered all the questions to patient and family satisfaction   Ihor Austin, AGNP-BC  [facility] [address] [place], [place] [address]  Phone [phone] Fax [phone] Note: This document was prepared with digital dictation and possible smart phrase technology. Any transcriptional errors that result from this process are unintentional.

## 2022-10-21 ENCOUNTER — Encounter: Payer: Self-pay | Admitting: Adult Health

## 2022-10-21 ENCOUNTER — Ambulatory Visit: Payer: Medicare Other | Admitting: Adult Health

## 2022-10-21 VITALS — BP 188/71 | HR 62 | Ht 66.0 in | Wt 164.4 lb

## 2022-10-21 DIAGNOSIS — B351 Tinea unguium: Secondary | ICD-10-CM | POA: Diagnosis not present

## 2022-10-21 DIAGNOSIS — E1151 Type 2 diabetes mellitus with diabetic peripheral angiopathy without gangrene: Secondary | ICD-10-CM | POA: Diagnosis not present

## 2022-10-21 DIAGNOSIS — R600 Localized edema: Secondary | ICD-10-CM | POA: Diagnosis not present

## 2022-10-21 DIAGNOSIS — I739 Peripheral vascular disease, unspecified: Secondary | ICD-10-CM | POA: Diagnosis not present

## 2022-10-21 DIAGNOSIS — R269 Unspecified abnormalities of gait and mobility: Secondary | ICD-10-CM

## 2022-10-21 DIAGNOSIS — F0280 Dementia in other diseases classified elsewhere without behavioral disturbance: Secondary | ICD-10-CM | POA: Diagnosis not present

## 2022-10-22 ENCOUNTER — Telehealth: Payer: Self-pay | Admitting: Adult Health

## 2022-10-22 NOTE — Telephone Encounter (Signed)
Pt has been accepted by [facility], they will contact her to set up. 

## 2022-10-26 DIAGNOSIS — I739 Peripheral vascular disease, unspecified: Secondary | ICD-10-CM | POA: Diagnosis not present

## 2022-10-28 NOTE — Telephone Encounter (Signed)
PT Debra Skinner with [facility] has called re: orders for Home health PT 1 week 1, 2 week 8 and a social work evaluation her call back # is [phone] vm is secure

## 2022-10-28 NOTE — Telephone Encounter (Signed)
Called [facility] and there was no answer. Left a detailed message advising the VO was ok from Ihor Austin, NP. Instructed to call back if anything else was needed.

## 2022-12-24 ENCOUNTER — Telehealth: Payer: Self-pay | Admitting: Adult Health

## 2022-12-24 NOTE — Telephone Encounter (Signed)
[name] from [facility] called wanting to inform provider that the pt's Debra Skinner has ended and pt is still not taking her medications on most days. Please call back to advise.

## 2022-12-24 NOTE — Telephone Encounter (Signed)
Called [facility] back. She states that they have ended the home health therapy. The family members set up her pill organizer each week with medication and the pt is not taking her medication. Every time they have gone in to the home her medications appear to have not been taken. Family members are aware and patient is set her ways. Anda Kraft states, "She is safer than what she was at home" but placement would be better as the dementia is progressing. The family members have not agreed to this yet.  From our standpoint, per last office visit pt was to follow up with PCP as management.

## 2023-06-29 ENCOUNTER — Other Ambulatory Visit: Payer: Self-pay

## 2023-06-29 ENCOUNTER — Encounter: Payer: Self-pay | Admitting: Emergency Medicine

## 2023-06-29 ENCOUNTER — Ambulatory Visit: Payer: Medicare Other

## 2023-06-29 ENCOUNTER — Ambulatory Visit
Admission: EM | Admit: 2023-06-29 | Discharge: 2023-06-29 | Disposition: A | Discharge status: Home / Self Care | Payer: Medicare Other | Attending: Family Medicine | Admitting: Family Medicine

## 2023-06-29 DIAGNOSIS — S62521A Displaced fracture of distal phalanx of right thumb, initial encounter for closed fracture: Secondary | ICD-10-CM

## 2023-06-29 DIAGNOSIS — E860 Dehydration: Secondary | ICD-10-CM

## 2023-06-29 DIAGNOSIS — S61012A Laceration without foreign body of left thumb without damage to nail, initial encounter: Secondary | ICD-10-CM

## 2023-06-29 DIAGNOSIS — W19XXXA Unspecified fall, initial encounter: Secondary | ICD-10-CM

## 2023-06-29 DIAGNOSIS — I10 Essential (primary) hypertension: Secondary | ICD-10-CM

## 2023-06-29 LAB — POCT URINALYSIS DIP (MANUAL ENTRY)
Bilirubin, UA: NEGATIVE
Glucose, UA: NEGATIVE mg/dL
Ketones, POC UA: NEGATIVE mg/dL
Leukocytes, UA: NEGATIVE
Nitrite, UA: NEGATIVE
Protein Ur, POC: 100 mg/dL — AB
Spec Grav, UA: 1.02 (ref 1.010–1.025)
Urobilinogen, UA: 0.2 E.U./dL
pH, UA: 5.5 (ref 5.0–8.0)

## 2023-06-29 LAB — POCT FASTING CBG KUC MANUAL ENTRY: POCT Glucose (KUC): 203 mg/dL — AB (ref 70–99)

## 2023-06-29 MED ORDER — DOXYCYCLINE HYCLATE 100 MG PO CAPS: 100.0000 mg | ORAL_CAPSULE | Freq: Two times a day (BID) | ORAL | 0 refills | Status: AC

## 2023-06-29 NOTE — Discharge Instructions (Addendum)
Patient can use to experiencing any weakness or abnormality of her gait beyond her normal baseline would recommend evaluation in the setting of the emergency department.  Her blood pressure was slightly elevated here in clinic today and she appears to be mildly dehydrated therefore encouraged increasing fluid intake over the next few days, as poor hydration can cause dizziness. Be sure to change dressing on thumb at least twice daily or when wet or soiled.  Given history of diabetes it is important to take antibiotics at least twice daily for the entire 10 days.  You will return here to urgent care in 10 days for suture removal.  However if you get concerned about any signs of infection or if any of the suture area will become disrupted please return sooner.

## 2023-06-29 NOTE — ED Triage Notes (Addendum)
Pt here after fall last night where she was unable to get up and laid in the floor for 8 hours; pt has laceration to left thumb; pt smells strongly of urine; pt called her family when she fell but they did not see call until the am; pt is also on blood thinners

## 2023-06-29 NOTE — ED Provider Notes (Signed)
[facility]    CSN: [id number] Arrival date & time: 06/29/23  0849      History   Chief Complaint Chief Complaint  Patient presents with   Fall    HPI Debra Skinner is a 89 y.o. female.   HPI Patient presents for evaluations of left thumb injury she sustained after a fall she sustained when walking from the bathroom in her home this morning. Patient reports being uncertain if she slipped and fell or passed out. Although she recalls that she decided to lay in the floor until 6 am as she did not want to wake her family up so early. She also has a medical alert necklace and reports to this writer that she did not want to disturb her neighbors by the EMS coming into the neighborhood making lots of noises. Patient has a history of recurrent falls and uses a cane at baseline. She suffers from dementia and per review of prior notes   Past Medical History:  Diagnosis Date   Abnormal EKG 09/22/2013   Atherosclerotic heart disease of native coronary artery without angina pectoris    Chronic kidney disease, stage 3a    Diabetic renal disease    Essential hypertension 09/22/2013   Gait abnormality 10/02/2021   Hyperglycemia due to type 2 diabetes mellitus    Iron deficiency anemia due to chronic blood loss    Lacunar infarction    left corona radiata   Low back pain    Major neurocognitive disorder due to multiple etiologies 03/31/2022   Mixed hyperlipidemia    Noncompliance with treatment    Osteopenia    Primary osteoarthritis    Tobacco use 10/26/2014   Type II diabetes mellitus 09/22/2013   Weakness     Patient Active Problem List   Diagnosis Date Noted   Lung nodules 05/14/2022   Major neurocognitive disorder due to multiple etiologies (HCC) 03/31/2022   Atherosclerotic heart disease of native coronary artery without angina pectoris    Chronic kidney disease, stage 3a    Diabetic renal disease    Hyperglycemia due to type 2 diabetes mellitus    Iron  deficiency anemia due to chronic blood loss    Low back pain    Mixed hyperlipidemia    Noncompliance with treatment    Osteopenia    Primary osteoarthritis    Weight decreased    Gait abnormality 10/02/2021   Lacunar infarction    Tobacco use 10/26/2014   Weakness    Essential hypertension 09/22/2013   Type II diabetes mellitus (HCC) 09/22/2013   Abnormal EKG 09/22/2013    Past Surgical History:  Procedure Laterality Date   CATARACT EXTRACTION Left    10-07-16   toe nail      OB History   No obstetric history on file.      Home Medications    Prior to Admission medications   Medication Sig Start Date End Date Taking? Authorizing Provider  doxycycline (VIBRAMYCIN) 100 MG capsule Take 1 capsule (100 mg total) by mouth 2 (two) times daily. 06/29/23  Yes Bing Neighbors, NP  amLODipine (NORVASC) 5 MG tablet Take 5 mg by mouth daily. Patient not taking: Reported on 08/07/2022    [provider]  clopidogrel (PLAVIX) 75 MG tablet Take 1 tablet (75 mg total) by mouth daily with breakfast. 11/24/16   Nilda Riggs, NP  Dulaglutide (TRULICITY [place]) Inject into the skin.    [provider]  Fe Fum-FePoly-Vit C-Vit B3 (INTEGRA)  62.5-62.5-40-3 MG CAPS Take 1 capsule by mouth daily. 05/05/17   [provider]  glimepiride (AMARYL) 4 MG tablet Take 4 mg by mouth daily. Patient not taking: Reported on 10/21/2022 02/27/17   [provider]  lisinopril-hydrochlorothiazide (PRINZIDE,ZESTORETIC) 20-12.5 MG per tablet Take 1 tablet by mouth daily.    [provider]    Family History Family History  Problem Relation Age of Onset   Cervical cancer Mother    High blood pressure Father    Aneurysm Father     Social History Social History   Tobacco Use   Smoking status: Every Day    Current packs/day: 0.50    Types: Cigarettes   Smokeless tobacco: Never   Tobacco comments:    Smoking at the most 1 carton 2 weeks.  08/07/22 hfb.   Substance Use Topics   Alcohol use: No    Alcohol/week: 0.0 standard drinks of alcohol   Drug use: No     Allergies   Penicillins   Review of Systems Review of Systems Pertinent negatives listed in HPI  Physical Exam Triage Vital Signs ED Triage Vitals  Encounter Vitals Group     BP 06/29/23 0910 (!) 201/67     Systolic BP Percentile --      Diastolic BP Percentile --      Pulse Rate 06/29/23 0910 69     Resp 06/29/23 0910 18     Temp 06/29/23 0910 97.8 F (36.6 C)     Temp Source 06/29/23 0910 Oral     SpO2 06/29/23 0910 98 %     Weight --      Height --      Head Circumference --      Peak Flow --      Pain Score 06/29/23 0911 5     Pain Loc --      Pain Education --      Exclude from Growth Chart --    No data found.  Updated Vital Signs BP (!) 160/104 (BP Location: Right Arm)   Pulse 69   Temp 97.8 F (36.6 C) (Oral)   Resp 18   SpO2 98%   Visual Acuity Right Eye Distance:   Left Eye Distance:   Bilateral Distance:    Right Eye Near:   Left Eye Near:    Bilateral Near:     Physical Exam Vitals reviewed.  Constitutional:      Comments: Chronically ill appearing   HENT:     Head: Normocephalic and atraumatic.  Eyes:     Extraocular Movements: Extraocular movements intact.     Conjunctiva/sclera: Conjunctivae normal.     Pupils: Pupils are equal, round, and reactive to light.  Cardiovascular:     Rate and Rhythm: Normal rate and regular rhythm.  Pulmonary:     Effort: Pulmonary effort is normal.     Breath sounds: Normal breath sounds.  Neurological:     Mental Status: She is alert. Mental status is at baseline.     Coordination: Coordination abnormal.     Gait: Gait abnormal.     Comments: Ambulates with a cane and walker at baseline                UC Treatments / Results  Labs (all labs ordered are listed, but only abnormal results are displayed) Labs Reviewed  POCT URINALYSIS DIP (MANUAL ENTRY) - Abnormal; Notable for  the following components:      Result Value   Blood,  UA trace-intact (*)    Protein Ur, POC =100 (*)    All other components within normal limits  POCT FASTING CBG [facility] MANUAL ENTRY - Abnormal; Notable for the following components:   POCT Glucose ([facility]) 203 (*)    All other components within normal limits    EKG   Radiology DG Finger Thumb Left Result Date: 06/29/2023 CLINICAL DATA:  Pain, fall EXAM: LEFT THUMB 2+V COMPARISON:  None Available. FINDINGS: There is an acute transverse minimally anteriorly displaced fracture through the base of the thumb distal phalanx with probable overlying soft tissue injury. There is no intra-articular extension. There is mild degenerative change about the interphalangeal joint. There is no erosive change. IMPRESSION: Acute minimally anteriorly displaced fracture through the base of the thumb distal phalanx without intra-articular extension. These results will be called to the ordering clinician or representative by the Radiologist Assistant, and communication documented in the PACS or [employer]. Electronically Signed   By: Lesia Hausen M.D.   On: 06/29/2023 12:16    Procedures Laceration Repair  Date/Time: 06/29/2023 11:03 AM  Performed by: Bing Neighbors, NP Authorized by: Bing Neighbors, NP   Consent:    Consent obtained:  Verbal   Consent given by:  Patient   Risks discussed:  Infection, need for additional repair, pain, poor cosmetic result and poor wound healing   Alternatives discussed:  No treatment and delayed treatment Universal protocol:    Procedure explained and questions answered to patient or proxy's satisfaction: yes     Relevant documents present and verified: yes     Test results available: yes     Imaging studies available: yes     Required blood products, implants, devices, and special equipment available: yes     Site/side marked: yes     Immediately prior to procedure, a time out was called: yes     Patient  identity confirmed:  Verbally with patient Anesthesia:    Anesthesia method:  Topical application and local infiltration   Topical anesthetic:  LET   Local anesthetic:  Lidocaine 1% w/o epi Laceration details:    Location:  Finger   Finger location:  L thumb   Length (cm):  5.2   Depth (mm):  1.5 Treatment:    Area cleansed with:  Povidone-iodine and soap and water   Amount of cleaning:  Standard Skin repair:    Repair method:  Sutures   Suture size:  3-0 and 5-0   Suture material:  Prolene   Suture technique:  Simple interrupted   Number of sutures:  8 Approximation:    Approximation:  Loose Repair type:    Repair type:  Intermediate Post-procedure details:    Dressing:  Bulky dressing and non-adherent dressing   Procedure completion:  Tolerated  (including critical care time)  Medications Ordered in UC Medications - No data to display  Initial Impression / Assessment and Plan / UC Course  I have reviewed the triage vital signs and the nursing notes.  Pertinent labs & imaging results that were available during my care of the patient were reviewed by me and considered in my medical decision making (see chart for details).    Fall, resulting in a secondary laceration to left thumb. Suture repair complicated due to portion of laceration has avulsed skin. Due to unknown mechanism of injury, obtained left thumb x-ray. Impression of left thumb revealed -"Acute minimally anteriorly displaced fracture through the base of the thumb distal phalanx without intra-articular extension".  Called caregiver, [name] as patient had left prior to results being available to notify of x-ray impression.  Caregiver advised that patient will follow-up at [facility] tomorrow provided contact information along with address.  Final Clinical Impressions(s) / UC Diagnoses   Final diagnoses:  Fall, initial encounter  Laceration of left thumb without foreign body without damage to nail, initial  encounter  Elevated blood pressure reading in office with diagnosis of hypertension     Discharge Instructions       Patient can use to experiencing any weakness or abnormality of her gait beyond her normal baseline would recommend evaluation in the setting of the emergency department.  Her blood pressure was slightly elevated here in clinic today and she appears to be mildly dehydrated therefore encouraged increasing fluid intake over the next few days, as poor hydration can cause dizziness. Be sure to change dressing on thumb at least twice daily or when wet or soiled.  Given history of diabetes it is important to take antibiotics at least twice daily for the entire 10 days.  You will return here to urgent care in 10 days for suture removal.  However if you get concerned about any signs of infection or if any of the suture area will become disrupted please return sooner.     ED Prescriptions     Medication Sig Dispense Auth. Provider   doxycycline (VIBRAMYCIN) 100 MG capsule Take 1 capsule (100 mg total) by mouth 2 (two) times daily. 20 capsule Bing Neighbors, NP      PDMP not reviewed this encounter.   Bing Neighbors, NP 06/29/23 1233

## 2023-06-30 ENCOUNTER — Telehealth: Payer: Self-pay

## 2023-06-30 NOTE — Telephone Encounter (Signed)
Returned call to family member (DPR on file) to discuss AVS and after visit care to include [facility] visit today (per request).  B. Roten CMA

## 2023-07-12 ENCOUNTER — Ambulatory Visit (HOSPITAL_BASED_OUTPATIENT_CLINIC_OR_DEPARTMENT_OTHER): Admission: RE | Admit: 2023-07-12 | Payer: Medicare Other | Source: Ambulatory Visit
